# Patient Record
Sex: Female | Born: 1940
Health system: Southern US, Community
[De-identification: ages and names within clinical notes are randomized; demographics above are authoritative.]

## PROBLEM LIST (undated history)

## (undated) DIAGNOSIS — M8580 Other specified disorders of bone density and structure, unspecified site: Secondary | ICD-10-CM

## (undated) DIAGNOSIS — B029 Zoster without complications: Secondary | ICD-10-CM

## (undated) HISTORY — DX: Zoster without complications: B02.9

## (undated) HISTORY — PX: BREAST SURGERY: SHX581

## (undated) HISTORY — PX: APPENDECTOMY: SHX54

## (undated) HISTORY — DX: Other specified disorders of bone density and structure, unspecified site: M85.80

## (undated) HISTORY — PX: ABDOMINAL HYSTERECTOMY: SHX81

---

## 2008-03-05 ENCOUNTER — Ambulatory Visit: Payer: Self-pay | Admitting: Family Medicine

## 2008-03-05 DIAGNOSIS — M8580 Other specified disorders of bone density and structure, unspecified site: Secondary | ICD-10-CM

## 2008-03-05 DIAGNOSIS — C4490 Unspecified malignant neoplasm of skin, unspecified: Secondary | ICD-10-CM

## 2008-03-05 HISTORY — DX: Other specified disorders of bone density and structure, unspecified site: M85.80

## 2008-09-19 ENCOUNTER — Ambulatory Visit: Payer: Self-pay | Admitting: Family Medicine

## 2008-09-19 DIAGNOSIS — R51 Headache: Secondary | ICD-10-CM | POA: Insufficient documentation

## 2008-09-19 DIAGNOSIS — R519 Headache, unspecified: Secondary | ICD-10-CM | POA: Insufficient documentation

## 2008-09-19 DIAGNOSIS — H1851 Endothelial corneal dystrophy: Secondary | ICD-10-CM

## 2008-10-03 ENCOUNTER — Encounter: Payer: Self-pay | Admitting: Family Medicine

## 2008-10-06 LAB — CONVERTED CEMR LAB
ALT: 14 units/L (ref 0–35)
AST: 17 units/L (ref 0–37)
Albumin: 4.3 g/dL (ref 3.5–5.2)
Calcium: 9.8 mg/dL (ref 8.4–10.5)
Chloride: 105 meq/L (ref 96–112)
Eosinophils Absolute: 0.3 10*3/uL (ref 0.0–0.7)
Hemoglobin: 14.1 g/dL (ref 12.0–15.0)
Lymphs Abs: 2.2 10*3/uL (ref 0.7–4.0)
MCV: 89.3 fL (ref 78.0–100.0)
Monocytes Relative: 10 % (ref 3–12)
Neutro Abs: 3.2 10*3/uL (ref 1.7–7.7)
Neutrophils Relative %: 50 % (ref 43–77)
Potassium: 4.6 meq/L (ref 3.5–5.3)
RBC: 5.04 M/uL (ref 3.87–5.11)
Sed Rate: 13 mm/hr (ref 0–22)
TSH: 2.947 microintl units/mL (ref 0.350–4.500)
Vit D, 25-Hydroxy: 18 ng/mL — ABNORMAL LOW (ref 30–89)
WBC: 6.5 10*3/uL (ref 4.0–10.5)

## 2008-10-09 ENCOUNTER — Encounter: Payer: Self-pay | Admitting: Family Medicine

## 2008-10-10 ENCOUNTER — Telehealth: Payer: Self-pay | Admitting: Family Medicine

## 2009-05-05 ENCOUNTER — Telehealth: Payer: Self-pay | Admitting: Family Medicine

## 2009-05-06 ENCOUNTER — Ambulatory Visit: Payer: Self-pay | Admitting: Family Medicine

## 2009-05-08 ENCOUNTER — Telehealth (INDEPENDENT_AMBULATORY_CARE_PROVIDER_SITE_OTHER): Payer: Self-pay | Admitting: *Deleted

## 2009-05-12 ENCOUNTER — Encounter: Payer: Self-pay | Admitting: Family Medicine

## 2010-03-23 NOTE — Progress Notes (Signed)
Summary: Out of work note  Phone Note Call from Patient Call back at [employer] [phone]   Caller: Patient Call For: [name] MD Summary of Call: Pt calls needing out of work note for Tuesday 15 through the 18th. fax to [phone] Initial call taken by: Kathlene November,  May 08, 2009 10:44 AM  Follow-up for Phone Call        OK to give work notes.  Follow-up by: [name] MD,  May 08, 2009 11:57 AM

## 2010-03-23 NOTE — Letter (Signed)
Summary: Out of Work  [facility]  [address]   [place], [place] [address]   Phone: [phone]  Fax: [phone]    May 12, 2009   Employee:  GLENIS MUSOLF [facility]    To Whom It May Concern:   For Medical reasons, please excuse the above named employee from work for the following dates:  Start:   May 05, 2009  End:   May 08, 2009  If you need additional information, please feel free to contact our office.         Sincerely,    [name], MD

## 2010-03-23 NOTE — Assessment & Plan Note (Signed)
Summary: FLU   Vital Signs:  Patient profile:   70 year old female Height:      62 inches Weight:      139 pounds O2 Sat:      99 % on Room air Temp:     97.8 degrees F oral Pulse rate:   88 / minute BP sitting:   109 / 76  (left arm) Cuff size:   regular  Vitals Entered By: Kathlene November (May 06, 2009 10:51 AM)  O2 Flow:  Room air CC: Monday- started with sneezing by night time chills and sore throat, H/A, head congestion, nasal congestion, alot of cough- actually feels better today per pt   Primary Care Provider:  [name] MD  CC:  Monday- started with sneezing by night time chills and sore throat, H/A, head congestion, nasal congestion, and alot of cough- actually feels better today per pt.  History of Present Illness: Monday- started with sneezing by night time chills and sore throat, H/A, head congestion, nasal congestion, alot of cough- actually feels better today per pt.  Bodyaches.  Took Nyquail and Tylenol and a throat soothing tea. Throat feels a little better today. Nasal congestion is improved.  Fever to 100.7 last night. + sick contacts (daughter had the flu).  NEver had a flu shot.  No N/V/D.  Dec appetite. Has mostly been in bed.    Current Medications (verified): 1)  Calcium 600 Mg Tabs (Calcium) .... Take One Tablet By Mouth Daily  Allergies (verified): No Known Drug Allergies  Comments:  Nurse/Medical Assistant: The patient's medications and allergies were reviewed with the patient and were updated in the Medication and Allergy Lists. Kathlene November (May 06, 2009 10:52 AM)  Physical Exam  General:  Well-developed,well-nourished,in no acute distress; alert,appropriate and cooperative throughout examination Head:  Normocephalic and atraumatic without obvious abnormalities. No apparent alopecia or balding. Eyes:  No corneal or conjunctival inflammation noted. EOMI. Perrla. Ears:  External ear exam shows no significant lesions or deformities.   Otoscopic examination reveals clear canals, tympanic membranes are intact bilaterally without bulging, retraction, inflammation or discharge. Hearing is grossly normal bilaterally. Nose:  External nasal examination shows no deformity or inflammation. Nasal mucosa are pink and moist without lesions or exudates. Mouth:  Oral mucosa and oropharynx without lesions or exudates.  Teeth in good repair. Neck:  No deformities, masses, or tenderness noted. Lungs:  Normal respiratory effort, chest expands symmetrically. Lungs are clear to auscultation, no crackles or wheezes. Heart:  Normal rate and regular rhythm. S1 and S2 normal without gallop, murmur, click, rub or other extra sounds. Skin:  no rashes.   Cervical Nodes:  No lymphadenopathy noted Psych:  Cognition and judgment appear intact. Alert and cooperative with normal attention span and concentration. No apparent delusions, illusions, hallucinations   Impression & Recommendations:  Problem # 1:  INFLUENZA (ICD-487.8)  Rest, increase fluids, use Tylenol [phone] mg every 4-6 hours, and avoid contact with others. call office if no improvement in 5-7 days or if symptoms worsen.   Complete Medication List: 1)  Calcium 600 Mg Tabs (Calcium) .... Take one tablet by mouth daily

## 2010-03-23 NOTE — Progress Notes (Signed)
Summary: Sick  Phone Note Call from Patient Call back at Home Phone [phone]   Caller: Patient Call For: [name] MD Summary of Call: Pt calls with cough, chills, sore throat, H/A, fever- was with daughter last week and daughter called her and was dx with the flu- her symtoms starte last night- offered her an appointmnet this morning and she said she can't - she cant get OOB. Uses [facility] on [address]. Please advise Initial call taken by: Kathlene November,  May 05, 2009 8:28 AM  Follow-up for Phone Call        If feels can get out of bedt can come in tomorrow. If it is the flu then may be a canddiate for tamiflu if evaluated in the first 48 hours.  Follow-up by: [name] MD,  May 05, 2009 9:59 AM  Additional Follow-up for Phone Call Additional follow up Details #1::        Pt notified and sent to schedule an appointment Additional Follow-up by: Kathlene November,  May 05, 2009 10:03 AM

## 2010-07-08 ENCOUNTER — Encounter: Payer: Self-pay | Admitting: Family Medicine

## 2010-07-13 ENCOUNTER — Encounter: Payer: Self-pay | Admitting: Family Medicine

## 2010-09-14 ENCOUNTER — Ambulatory Visit (INDEPENDENT_AMBULATORY_CARE_PROVIDER_SITE_OTHER): Payer: Commercial Indemnity | Admitting: Family Medicine

## 2010-09-14 ENCOUNTER — Encounter: Payer: Self-pay | Admitting: Family Medicine

## 2010-09-14 DIAGNOSIS — Z23 Encounter for immunization: Secondary | ICD-10-CM

## 2010-09-14 DIAGNOSIS — Z0000 Encounter for general adult medical examination without abnormal findings: Secondary | ICD-10-CM

## 2010-09-14 DIAGNOSIS — Z1231 Encounter for screening mammogram for malignant neoplasm of breast: Secondary | ICD-10-CM

## 2010-09-14 DIAGNOSIS — Z2911 Encounter for prophylactic immunotherapy for respiratory syncytial virus (RSV): Secondary | ICD-10-CM

## 2010-09-14 LAB — LIPID PANEL
LDL Cholesterol: 66 mg/dL (ref 0–99)
Triglycerides: 104 mg/dL (ref <150)
VLDL: 21 mg/dL (ref 0–40)

## 2010-09-14 NOTE — Patient Instructions (Signed)
Keep up a regular exercise program and make sure you are eating a healthy diet Keep taking your  calcium supplement (500mg  twice a day). We will schedule your mammogram.

## 2010-09-14 NOTE — Progress Notes (Signed)
  Subjective:     Alexis Small is a 70 y.o. female and is here for a comprehensive physical exam. The patient reports no problems.Seh stopped eating all dairy adn all caffeine and feels much better. Her left side pain has resolved.  She does take a calcium supplement and gets some exercise.   History   Social History  . Marital Status: Widowed    Spouse Name: N/A    Number of Children: 3  . Years of Education: N/A   Occupational History  .  [employer]   Social History Main Topics  . Smoking status: Never Smoker   . Smokeless tobacco: Not on file  . Alcohol Use: No  . Drug Use: No  . 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   Other Topics Concern  . Not on file   Social History Narrative   Walking for exercise.  No daily caffeine.    Health Maintenance  Topic Date Due  . Tetanus/tdap  08/15/1959  . Zostavax  08/14/2000  . Pneumococcal Polysaccharide Vaccine Age 29 And Over  08/14/2005  . Influenza Vaccine  04/17/2011  . Colonoscopy  09/13/2020    The following portions of the patient's history were reviewed and updated as appropriate: allergies, current medications, past family history, past medical history, past social history and past surgical history.  Review of Systems A comprehensive review of systems was negative.   Objective:    BP 128/75  Pulse 73  Ht 5' 1.25" (1.556 m)  Wt 145 lb (65.772 kg)  BMI 27.17 kg/m2 General appearance: alert, cooperative and appears stated age Head: Normocephalic, without obvious abnormality, atraumatic Eyes: conjunctiva are clear, PEERLA, EOMi Ears: normal TM's and external ear canals both ears Nose: no discharge Throat: lips, mucosa, and tongue normal; teeth and gums normal Neck: no adenopathy, no carotid bruit, supple, symmetrical, trachea midline and thyroid not enlarged, symmetric, no tenderness/mass/nodules Back: symmetric, no curvature. ROM normal.  No CVA tenderness. Lungs: clear to auscultation bilaterally Breasts: normal appearance, no masses or tenderness Heart: regular rate and rhythm, S1, S2 normal, no murmur, click, rub or gallop Abdomen: soft, non-tender; bowel sounds normal; no masses,  no organomegaly Extremities: extremities normal, atraumatic, no cyanosis or edema Pulses: 2+ and symmetric Skin: Skin color, texture, turgor normal. No rashes or lesions Lymph nodes: Cervical, supraclavicular, and axillary nodes normal. Neurologic: Alert and oriented X 3, normal strength and tone. Normal symmetric reflexes. Normal coordination and gait    Assessment:    Healthy female exam.   Plan:     See After Visit Summary for Counseling Recommendations  1. Given tdap and shingles vaccines 2. Will look in old chart for PNA vaccines 3. She declines all future flu vaccinse 4. She declines bone density 5. Last mammo 4 years ago but she is agreeable to that.  Start a regular exercise program and make sure you are eating a healthy diet Try to eat 4 servings of dairy a day or take a calcium supplement (500mg  twice a day). Your vaccines are up to date.

## 2010-09-15 ENCOUNTER — Telehealth: Payer: Self-pay | Admitting: Family Medicine

## 2010-09-15 LAB — COMPLETE METABOLIC PANEL WITH GFR
ALT: <8 U/L (ref 0–35)
AST: 15 U/L (ref 0–37)
Albumin: 4.3 g/dL (ref 3.5–5.2)
Calcium: 9.3 mg/dL (ref 8.4–10.5)
Chloride: 106 mEq/L (ref 96–112)
Creat: 0.81 mg/dL (ref 0.50–1.10)
Potassium: 4.1 mEq/L (ref 3.5–5.3)

## 2010-09-15 NOTE — Telephone Encounter (Signed)
Call patient: Blood sugar still borderline. Vitamin D is definitely coming up. If still technically limited that I missed a normal range. Keep taking her vitamin D supplement. Cholesterol looks great

## 2010-09-15 NOTE — Telephone Encounter (Signed)
Left message on vm with results  

## 2010-11-09 ENCOUNTER — Ambulatory Visit
Admission: RE | Admit: 2010-11-09 | Discharge: 2010-11-09 | Disposition: A | Discharge status: Home / Self Care | Payer: Commercial Indemnity | Source: Ambulatory Visit | Attending: Family Medicine | Admitting: Family Medicine

## 2010-11-09 DIAGNOSIS — Z1231 Encounter for screening mammogram for malignant neoplasm of breast: Secondary | ICD-10-CM

## 2010-11-12 ENCOUNTER — Telehealth: Payer: Self-pay | Admitting: *Deleted

## 2010-11-12 NOTE — Telephone Encounter (Signed)
Message copied by Wyline Beady on Fri Nov 12, 2010  2:56 PM ------      Message from: [name] D      Created: Fri Nov 12, 2010 12:36 PM       Please call patient. Normal mammogram.  Repeat in 1 year.

## 2010-11-12 NOTE — Telephone Encounter (Signed)
Pt.notified

## 2010-11-23 ENCOUNTER — Ambulatory Visit (INDEPENDENT_AMBULATORY_CARE_PROVIDER_SITE_OTHER): Payer: Commercial Indemnity | Admitting: Family Medicine

## 2010-11-23 ENCOUNTER — Other Ambulatory Visit: Payer: Self-pay | Admitting: Family Medicine

## 2010-11-23 ENCOUNTER — Encounter: Payer: Self-pay | Admitting: Family Medicine

## 2010-11-23 VITALS — BP 134/84 | HR 70 | Temp 98.5°F | Wt 147.0 lb

## 2010-11-23 DIAGNOSIS — J029 Acute pharyngitis, unspecified: Secondary | ICD-10-CM

## 2010-11-23 NOTE — Progress Notes (Signed)
  Subjective:    Patient ID: Alexis Small, female    DOB: 18-Aug-1940, 70 y.o.   MRN: [id number]  HPI  ST on the Rt side for 4 days. Painful to swallow.  No fever.  No nasal congestion or drainage. No cough.  No sneezing or itching.  No ear pain or pressure today. Some era irritation on the RT on Sat. Has been taking nyquail nad salt water gargles. + sick contacts. Has been suing lozenges as well.   Review of Systems     Objective:   Physical Exam  Constitutional: She is oriented to person, place, and time. She appears well-developed and well-nourished.  HENT:  Head: Normocephalic and atraumatic.  Right Ear: External ear normal.  Left Ear: External ear normal.  Nose: Nose normal.  Mouth/Throat: Oropharynx is clear and moist.       TMs and canals are clear.   Eyes: Conjunctivae and EOM are normal. Pupils are equal, round, and reactive to light.  Neck: Neck supple. No thyromegaly present.       MIld tender bilat ant cerv LN.   Cardiovascular: Normal rate, regular rhythm and normal heart sounds.   Pulmonary/Chest: Effort normal and breath sounds normal. She has no wheezes.  Lymphadenopathy:    She has cervical adenopathy.  Neurological: She is alert and oriented to person, place, and time.  Skin: Skin is warm and dry.  Psychiatric: She has a normal mood and affect. Her behavior is normal.          Assessment & Plan:  Pharyngitis - Rapid is neg.  Will send a culture. Continue salt water gargles. Recommend zinc lozenges to shorten the duration of hte illness. Call if getting worse or just not getting better.

## 2010-11-23 NOTE — Patient Instructions (Signed)
Recommend zinc lozenges to shorten the duration of hte illness. Call if getting worse or just not getting better.

## 2010-11-24 ENCOUNTER — Telehealth: Payer: Self-pay | Admitting: Family Medicine

## 2010-11-24 NOTE — Telephone Encounter (Signed)
Pt seen yesterday and is complaining of ST, but since she called this am is starting to feel better.  Will call tomorrow if gets worse. Jarvis Newcomer, LPN [facility]

## 2010-11-28 LAB — CULTURE, GROUP A STREP: Organism ID, Bacteria: NORMAL

## 2010-11-29 ENCOUNTER — Telehealth: Payer: Self-pay | Admitting: *Deleted

## 2010-11-29 NOTE — Telephone Encounter (Signed)
Message copied by Lanae Crumbly on Mon Nov 29, 2010  9:35 AM ------      Message from: [name] D      Created: Mon Nov 29, 2010  7:46 AM       Strep throat culture is neg. Hopefully she is starting to feel better.

## 2010-11-29 NOTE — Telephone Encounter (Signed)
Pt notiifed of results. States is feeling much better. KJ LPN

## 2011-05-23 ENCOUNTER — Ambulatory Visit: Payer: Commercial Indemnity | Admitting: Physician Assistant

## 2011-07-20 ENCOUNTER — Encounter: Payer: Self-pay | Admitting: Physician Assistant

## 2011-07-20 ENCOUNTER — Ambulatory Visit (INDEPENDENT_AMBULATORY_CARE_PROVIDER_SITE_OTHER): Payer: Medicare Other | Admitting: Physician Assistant

## 2011-07-20 VITALS — BP 142/72 | HR 67 | Ht 61.25 in | Wt 149.0 lb

## 2011-07-20 DIAGNOSIS — M25552 Pain in left hip: Secondary | ICD-10-CM

## 2011-07-20 DIAGNOSIS — M545 Low back pain: Secondary | ICD-10-CM

## 2011-07-20 DIAGNOSIS — M25559 Pain in unspecified hip: Secondary | ICD-10-CM

## 2011-07-20 MED ORDER — CYCLOBENZAPRINE HCL 5 MG PO TABS: 5.0000 mg | ORAL_TABLET | Freq: Every evening | ORAL | Status: DC | PRN

## 2011-07-20 MED ORDER — TRAMADOL HCL 50 MG PO TABS: 50.0000 mg | ORAL_TABLET | Freq: Three times a day (TID) | ORAL | Status: AC | PRN

## 2011-07-20 NOTE — Patient Instructions (Addendum)
Continue taking Motrin up to 800mg  three times a day. Use flexeril at night. Tramadol for pain every 8hrs or as needed. Apply ice on affected areas.  Low Back Strain with Rehab A strain is an injury in which a tendon or muscle is torn. The muscles and tendons of the lower back are vulnerable to strains. However, these muscles and tendons are very strong and require a great force to be injured. Strains are classified into three categories. Grade 1 strains cause pain, but the tendon is not lengthened. Grade 2 strains include a lengthened ligament, due to the ligament being stretched or partially ruptured. With grade 2 strains there is still function, although the function may be decreased. Grade 3 strains involve a complete tear of the tendon or muscle, and function is usually impaired. SYMPTOMS   Pain in the lower back.   Pain that affects one side more than the other.   Pain that gets worse with movement and may be felt in the hip, buttocks, or back of the thigh.   Muscle spasms of the muscles in the back.   Swelling along the muscles of the back.   Loss of strength of the back muscles.   Crackling sound (crepitation) when the muscles are touched.  CAUSES  Lower back strains occur when a force is placed on the muscles or tendons that is greater than they can handle. Common causes of injury include:  Prolonged overuse of the muscle-tendon units in the lower back, usually from incorrect posture.   A single violent injury or force applied to the back.  RISK INCREASES WITH:  Sports that involve twisting forces on the spine or a lot of bending at the waist (football, rugby, weightlifting, bowling, golf, tennis, speed skating, racquetball, swimming, running, gymnastics, diving).   Poor strength and flexibility.   Failure to warm up properly before activity.   Family history of lower back pain or disk disorders.   Previous back injury or surgery (especially fusion).   Poor posture with  lifting, especially heavy objects.   Prolonged sitting, especially with poor posture.  PREVENTION   Learn and use proper posture when sitting or lifting (maintain proper posture when sitting, lift using the knees and legs, not at the waist).   Warm up and stretch properly before activity.   Allow for adequate recovery between workouts.   Maintain physical fitness:   Strength, flexibility, and endurance.   Cardiovascular fitness.  PROGNOSIS  If treated properly, lower back strains usually heal within 6 weeks. RELATED COMPLICATIONS   Recurring symptoms, resulting in a chronic problem.   Chronic inflammation, scarring, and partial muscle-tendon tear.   Delayed healing or resolution of symptoms.   Prolonged disability.  TREATMENT  Treatment first involves the use of ice and medicine, to reduce pain and inflammation. The use of strengthening and stretching exercises may help reduce pain with activity. These exercises may be performed at home or with a therapist. Severe injuries may require referral to a therapist for further evaluation and treatment, such as ultrasound. Your caregiver may advise that you wear a back brace or corset, to help reduce pain and discomfort. Often, prolonged bed rest results in greater harm then benefit. Corticosteroid injections may be recommended. However, these should be reserved for the most serious cases. It is important to avoid using your back when lifting objects. At night, sleep on your back on a firm mattress with a pillow placed under your knees. If non-surgical treatment is unsuccessful, surgery may  be needed.  MEDICATION   If pain medicine is needed, nonsteroidal anti-inflammatory medicines (aspirin and ibuprofen), or other minor pain relievers (acetaminophen), are often advised.   Do not take pain medicine for 7 days before surgery.   Prescription pain relievers may be given, if your caregiver thinks they are needed. Use only as directed and only  as much as you need.   Ointments applied to the skin may be helpful.   Corticosteroid injections may be given by your caregiver. These injections should be reserved for the most serious cases, because they may only be given a certain number of times.  HEAT AND COLD  Cold treatment (icing) should be applied for 10 to 15 minutes every 2 to 3 hours for inflammation and pain, and immediately after activity that aggravates your symptoms. Use ice packs or an ice massage.   Heat treatment may be used before performing stretching and strengthening activities prescribed by your caregiver, physical therapist, or athletic trainer. Use a heat pack or a warm water soak.  SEEK MEDICAL CARE IF:   Symptoms get worse or do not improve in 2 to 4 weeks, despite treatment.   You develop numbness, weakness, or loss of bowel or bladder function.   New, unexplained symptoms develop. (Drugs used in treatment may produce side effects.)  EXERCISES  RANGE OF MOTION (ROM) AND STRETCHING EXERCISES - Low Back Strain Most people with lower back pain will find that their symptoms get worse with excessive bending forward (flexion) or arching at the lower back (extension). The exercises which will help resolve your symptoms will focus on the opposite motion.  Your physician, physical therapist or athletic trainer will help you determine which exercises will be most helpful to resolve your lower back pain. Do not complete any exercises without first consulting with your caregiver. Discontinue any exercises which make your symptoms worse until you speak to your caregiver.  If you have pain, numbness or tingling which travels down into your buttocks, leg or foot, the goal of the therapy is for these symptoms to move closer to your back and eventually resolve. Sometimes, these leg symptoms will get better, but your lower back pain may worsen. This is typically an indication of progress in your rehabilitation. Be very alert to any  changes in your symptoms and the activities in which you participated in the 24 hours prior to the change. Sharing this information with your caregiver will allow him/her to most efficiently treat your condition.  These exercises may help you when beginning to rehabilitate your injury. Your symptoms may resolve with or without further involvement from your physician, physical therapist or athletic trainer. While completing these exercises, remember:   Restoring tissue flexibility helps normal motion to return to the joints. This allows healthier, less painful movement and activity.   An effective stretch should be held for at least 30 seconds.   A stretch should never be painful. You should only feel a gentle lengthening or release in the stretched tissue.  FLEXION RANGE OF MOTION AND STRETCHING EXERCISES: STRETCH - Flexion, Single Knee to Chest   Lie on a firm bed or floor with both legs extended in front of you.   Keeping one leg in contact with the floor, bring your opposite knee to your chest. Hold your leg in place by either grabbing behind your thigh or at your knee.   Pull until you feel a gentle stretch in your lower back. Hold __________ seconds.   Slowly release your  grasp and repeat the exercise with the opposite side.  Repeat __________ times. Complete this exercise __________ times per day.  STRETCH - Flexion, Double Knee to Chest   Lie on a firm bed or floor with both legs extended in front of you.   Keeping one leg in contact with the floor, bring your opposite knee to your chest.   Tense your stomach muscles to support your back and then lift your other knee to your chest. Hold your legs in place by either grabbing behind your thighs or at your knees.   Pull both knees toward your chest until you feel a gentle stretch in your lower back. Hold __________ seconds.   Tense your stomach muscles and slowly return one leg at a time to the floor.  Repeat __________ times.  Complete this exercise __________ times per day.  STRETCH - Low Trunk Rotation  Lie on a firm bed or floor. Keeping your legs in front of you, bend your knees so they are both pointed toward the ceiling and your feet are flat on the floor.   Extend your arms out to the side. This will stabilize your upper body by keeping your shoulders in contact with the floor.   Gently and slowly drop both knees together to one side until you feel a gentle stretch in your lower back. Hold for __________ seconds.   Tense your stomach muscles to support your lower back as you bring your knees back to the starting position. Repeat the exercise to the other side.  Repeat __________ times. Complete this exercise __________ times per day  EXTENSION RANGE OF MOTION AND FLEXIBILITY EXERCISES: STRETCH - Extension, Prone on Elbows   Lie on your stomach on the floor, a bed will be too soft. Place your palms about shoulder width apart and at the height of your head.   Place your elbows under your shoulders. If this is too painful, stack pillows under your chest.   Allow your body to relax so that your hips drop lower and make contact more completely with the floor.   Hold this position for __________ seconds.   Slowly return to lying flat on the floor.  Repeat __________ times. Complete this exercise __________ times per day.  RANGE OF MOTION - Extension, Prone Press Ups  Lie on your stomach on the floor, a bed will be too soft. Place your palms about shoulder width apart and at the height of your head.   Keeping your back as relaxed as possible, slowly straighten your elbows while keeping your hips on the floor. You may adjust the placement of your hands to maximize your comfort. As you gain motion, your hands will come more underneath your shoulders.   Hold this position __________ seconds.   Slowly return to lying flat on the floor.  Repeat __________ times. Complete this exercise __________ times per day.    RANGE OF MOTION- Quadruped, Neutral Spine   Assume a hands and knees position on a firm surface. Keep your hands under your shoulders and your knees under your hips. You may place padding under your knees for comfort.   Drop your head and point your tail bone toward the ground below you. This will round out your lower back like an angry cat. Hold this position for __________ seconds.   Slowly lift your head and release your tail bone so that your back sags into a large arch, like an old horse.   Hold this position for __________  seconds.   Repeat this until you feel limber in your lower back.   Now, find your "sweet spot." This will be the most comfortable position somewhere between the two previous positions. This is your neutral spine. Once you have found this position, tense your stomach muscles to support your lower back.   Hold this position for __________ seconds.  Repeat __________ times. Complete this exercise __________ times per day.  STRENGTHENING EXERCISES - Low Back Strain These exercises may help you when beginning to rehabilitate your injury. These exercises should be done near your "sweet spot." This is the neutral, low-back arch, somewhere between fully rounded and fully arched, that is your least painful position. When performed in this safe range of motion, these exercises can be used for people who have either a flexion or extension based injury. These exercises may resolve your symptoms with or without further involvement from your physician, physical therapist or athletic trainer. While completing these exercises, remember:   Muscles can gain both the endurance and the strength needed for everyday activities through controlled exercises.   Complete these exercises as instructed by your physician, physical therapist or athletic trainer. Increase the resistance and repetitions only as guided.   You may experience muscle soreness or fatigue, but the pain or discomfort you  are trying to eliminate should never worsen during these exercises. If this pain does worsen, stop and make certain you are following the directions exactly. If the pain is still present after adjustments, discontinue the exercise until you can discuss the trouble with your caregiver.  STRENGTHENING - Deep Abdominals, Pelvic Tilt  Lie on a firm bed or floor. Keeping your legs in front of you, bend your knees so they are both pointed toward the ceiling and your feet are flat on the floor.   Tense your lower abdominal muscles to press your lower back into the floor. This motion will rotate your pelvis so that your tail bone is scooping upwards rather than pointing at your feet or into the floor.   With a gentle tension and even breathing, hold this position for __________ seconds.  Repeat __________ times. Complete this exercise __________ times per day.  STRENGTHENING - Abdominals, Crunches   Lie on a firm bed or floor. Keeping your legs in front of you, bend your knees so they are both pointed toward the ceiling and your feet are flat on the floor. Cross your arms over your chest.   Slightly tip your chin down without bending your neck.   Tense your abdominals and slowly lift your trunk high enough to just clear your shoulder blades. Lifting higher can put excessive stress on the lower back and does not further strengthen your abdominal muscles.   Control your return to the starting position.  Repeat __________ times. Complete this exercise __________ times per day.  STRENGTHENING - Quadruped, Opposite UE/LE Lift   Assume a hands and knees position on a firm surface. Keep your hands under your shoulders and your knees under your hips. You may place padding under your knees for comfort.   Find your neutral spine and gently tense your abdominal muscles so that you can maintain this position. Your shoulders and hips should form a rectangle that is parallel with the floor and is not twisted.    Keeping your trunk steady, lift your right hand no higher than your shoulder and then your left leg no higher than your hip. Make sure you are not holding your breath. Hold this  position __________ seconds.   Continuing to keep your abdominal muscles tense and your back steady, slowly return to your starting position. Repeat with the opposite arm and leg.  Repeat __________ times. Complete this exercise __________ times per day.  STRENGTHENING - Lower Abdominals, Double Knee Lift  Lie on a firm bed or floor. Keeping your legs in front of you, bend your knees so they are both pointed toward the ceiling and your feet are flat on the floor.   Tense your abdominal muscles to brace your lower back and slowly lift both of your knees until they come over your hips. Be certain not to hold your breath.   Hold __________ seconds. Using your abdominal muscles, return to the starting position in a slow and controlled manner.  Repeat __________ times. Complete this exercise __________ times per day.  POSTURE AND BODY MECHANICS CONSIDERATIONS - Low Back Strain Keeping correct posture when sitting, standing or completing your activities will reduce the stress put on different body tissues, allowing injured tissues a chance to heal and limiting painful experiences. The following are general guidelines for improved posture. Your physician or physical therapist will provide you with any instructions specific to your needs. While reading these guidelines, remember:  The exercises prescribed by your provider will help you have the flexibility and strength to maintain correct postures.   The correct posture provides the best environment for your joints to work. All of your joints have less wear and tear when properly supported by a spine with good posture. This means you will experience a healthier, less painful body.   Correct posture must be practiced with all of your activities, especially prolonged sitting and  standing. Correct posture is as important when doing repetitive low-stress activities (typing) as it is when doing a single heavy-load activity (lifting).  RESTING POSITIONS Consider which positions are most painful for you when choosing a resting position. If you have pain with flexion-based activities (sitting, bending, stooping, squatting), choose a position that allows you to rest in a less flexed posture. You would want to avoid curling into a fetal position on your side. If your pain worsens with extension-based activities (prolonged standing, working overhead), avoid resting in an extended position such as sleeping on your stomach. Most people will find more comfort when they rest with their spine in a more neutral position, neither too rounded nor too arched. Lying on a non-sagging bed on your side with a pillow between your knees, or on your back with a pillow under your knees will often provide some relief. Keep in mind, being in any one position for a prolonged period of time, no matter how correct your posture, can still lead to stiffness. PROPER SITTING POSTURE In order to minimize stress and discomfort on your spine, you must sit with correct posture. Sitting with good posture should be effortless for a healthy body. Returning to good posture is a gradual process. Many people can work toward this most comfortably by using various supports until they have the flexibility and strength to maintain this posture on their own. When sitting with proper posture, your ears will fall over your shoulders and your shoulders will fall over your hips. You should use the back of the chair to support your upper back. Your lower back will be in a neutral position, just slightly arched. You may place a small pillow or folded towel at the base of your lower back for support.  When working at a desk, create an  environment that supports good, upright posture. Without extra support, muscles tire, which leads to  excessive strain on joints and other tissues. Keep these recommendations in mind: CHAIR:  A chair should be able to slide under your desk when your back makes contact with the back of the chair. This allows you to work closely.   The chair's height should allow your eyes to be level with the upper part of your monitor and your hands to be slightly lower than your elbows.  BODY POSITION  Your feet should make contact with the floor. If this is not possible, use a foot rest.   Keep your ears over your shoulders. This will reduce stress on your neck and lower back.  INCORRECT SITTING POSTURES  If you are feeling tired and unable to assume a healthy sitting posture, do not slouch or slump. This puts excessive strain on your back tissues, causing more damage and pain. Healthier options include:  Using more support, like a lumbar pillow.   Switching tasks to something that requires you to be upright or walking.   Talking a brief walk.   Lying down to rest in a neutral-spine position.  PROLONGED STANDING WHILE SLIGHTLY LEANING FORWARD  When completing a task that requires you to lean forward while standing in one place for a long time, place either foot up on a stationary 2-4 inch high object to help maintain the best posture. When both feet are on the ground, the lower back tends to lose its slight inward curve. If this curve flattens (or becomes too large), then the back and your other joints will experience too much stress, tire more quickly, and can cause pain. CORRECT STANDING POSTURES Proper standing posture should be assumed with all daily activities, even if they only take a few moments, like when brushing your teeth. As in sitting, your ears should fall over your shoulders and your shoulders should fall over your hips. You should keep a slight tension in your abdominal muscles to brace your spine. Your tailbone should point down to the ground, not behind your body, resulting in an  over-extended swayback posture.  INCORRECT STANDING POSTURES  Common incorrect standing postures include a forward head, locked knees and/or an excessive swayback. WALKING Walk with an upright posture. Your ears, shoulders and hips should all line-up. PROLONGED ACTIVITY IN A FLEXED POSITION When completing a task that requires you to bend forward at your waist or lean over a low surface, try to find a way to stabilize 3 out of 4 of your limbs. You can place a hand or elbow on your thigh or rest a knee on the surface you are reaching across. This will provide you more stability so that your muscles do not fatigue as quickly. By keeping your knees relaxed, or slightly bent, you will also reduce stress across your lower back. CORRECT LIFTING TECHNIQUES DO :   Assume a wide stance. This will provide you more stability and the opportunity to get as close as possible to the object which you are lifting.   Tense your abdominals to brace your spine. Bend at the knees and hips. Keeping your back locked in a neutral-spine position, lift using your leg muscles. Lift with your legs, keeping your back straight.   Test the weight of unknown objects before attempting to lift them.   Try to keep your elbows locked down at your sides in order get the best strength from your shoulders when carrying an object.  Always ask for help when lifting heavy or awkward objects.  INCORRECT LIFTING TECHNIQUES DO NOT:   Lock your knees when lifting, even if it is a small object.   Bend and twist. Pivot at your feet or move your feet when needing to change directions.   Assume that you can safely pick up even a paper clip without proper posture.  Document Released: 02/07/2005 Document Revised: 01/27/2011 Document Reviewed: 05/22/2008 [facility] Patient Information 2012 [place], [place].

## 2011-07-20 NOTE — Progress Notes (Signed)
  Subjective:    Patient ID: Alexis Small, female    DOB: 31-Mar-1940, 71 y.o.   MRN: [id number]  HPI Patient has a history of back problems and has had an exacerbation that started 3 days ago. It started from when she spent all day Sunday pruning bushes. She actually got stuck in garden and daughter had to get her out. Since her back has been hurting a lot and she has felt like she can't move. The pain in mostly in lower back and left hip.2 days ago it hurt ot even lay down. Now the pain is better when she sits or lays down. Standing and walking create a lot of pain. She has been taking a lot of ibuprofen daily and it does help. She tried some OTC patches for paian and they don't help either. She denies any numbness or tingling running down leg. She did try taking a hot bath and it helped some. Rates pain      Review of Systems     Objective:   Physical Exam  Constitutional: She is oriented to person, place, and time. She appears well-developed and well-nourished.  HENT:  Head: Normocephalic and atraumatic.  Right Ear: External ear normal.  Left Ear: External ear normal.  Nose: Nose normal.  Mouth/Throat: Oropharynx is clear and moist.  Cardiovascular: Normal rate, regular rhythm and normal heart sounds.   Pulmonary/Chest: Effort normal and breath sounds normal.  Musculoskeletal:       Limited ROM at waist not able to forward flex or extend. Pt had trouble getting out of chair. No bony tenderness over spine. Paraspinus tenderness in lumbar region. Negative bilateral straight leg test. Left hip pain over bursa. Strength 5/5 of bilateral legs.DtRs 2+ bilaterally.   Neurological: She is alert and oriented to person, place, and time.  Skin: Skin is warm and dry.  Psychiatric: She has a normal mood and affect. Her behavior is normal.          Assessment & Plan:  Low back pain/left hip pain- Continue taking Motrin up to 800mg  three times a day. Use flexeril at night. Tramadol for  pain every 8hrs or as needed. Apply ice on affected areas. Will not get x-rays today but if pain persists will get imaging and consider physical therapy. Will give stretches to do daily for pain.

## 2011-12-15 ENCOUNTER — Ambulatory Visit (INDEPENDENT_AMBULATORY_CARE_PROVIDER_SITE_OTHER): Payer: Medicare Other | Admitting: Family Medicine

## 2011-12-15 ENCOUNTER — Encounter: Payer: Self-pay | Admitting: Family Medicine

## 2011-12-15 VITALS — BP 132/77 | HR 64 | Ht 61.0 in | Wt 147.0 lb

## 2011-12-15 DIAGNOSIS — M810 Age-related osteoporosis without current pathological fracture: Secondary | ICD-10-CM

## 2011-12-15 DIAGNOSIS — Z0000 Encounter for general adult medical examination without abnormal findings: Secondary | ICD-10-CM

## 2011-12-15 DIAGNOSIS — N951 Menopausal and female climacteric states: Secondary | ICD-10-CM

## 2011-12-15 DIAGNOSIS — Z1231 Encounter for screening mammogram for malignant neoplasm of breast: Secondary | ICD-10-CM

## 2011-12-15 DIAGNOSIS — Z23 Encounter for immunization: Secondary | ICD-10-CM

## 2011-12-15 DIAGNOSIS — Z13220 Encounter for screening for lipoid disorders: Secondary | ICD-10-CM

## 2011-12-15 LAB — COMPLETE METABOLIC PANEL WITH GFR
ALT: 16 U/L (ref 0–35)
AST: 20 U/L (ref 0–37)
Albumin: 4.5 g/dL (ref 3.5–5.2)
Alkaline Phosphatase: 75 U/L (ref 39–117)
BUN: 15 mg/dL (ref 6–23)
Calcium: 9.8 mg/dL (ref 8.4–10.5)
Chloride: 104 mEq/L (ref 96–112)
Potassium: 4.7 mEq/L (ref 3.5–5.3)
Sodium: 141 mEq/L (ref 135–145)
Total Protein: 7.2 g/dL (ref 6.0–8.3)

## 2011-12-15 LAB — LIPID PANEL
Cholesterol: 198 mg/dL (ref 0–200)
LDL Cholesterol: 103 mg/dL — ABNORMAL HIGH (ref 0–99)
Total CHOL/HDL Ratio: 2.5 Ratio
VLDL: 16 mg/dL (ref 0–40)

## 2011-12-15 NOTE — Progress Notes (Signed)
Subjective:    Alexis Small is a 71 y.o. female who presents for Medicare Annual/Subsequent preventive examination.  Preventive Screening-Counseling & Management  Tobacco History  Smoking status  . Never Smoker   Smokeless tobacco  . Not on file     Problems Prior to Visit 1.   Current Problems (verified) Patient Active Problem List  Diagnosis  . CARCINOMA, SKIN, SQUAMOUS CELL  . FUCH'S DYSTROPHY  . OSTEOPOROSIS  . HEADACHE, CHRONIC    Medications Prior to Visit Current Outpatient Prescriptions on File Prior to Visit  Medication Sig Dispense Refill  . calcium carbonate (OS-CAL) 600 MG TABS Take 600 mg by mouth daily.        . cyclobenzaprine (FLEXERIL) 5 MG tablet Take 1 tablet (5 mg total) by mouth at bedtime as needed for muscle spasms.  30 tablet  0    Current Medications (verified) Current Outpatient Prescriptions  Medication Sig Dispense Refill  . calcium carbonate (OS-CAL) 600 MG TABS Take 600 mg by mouth daily.        . cyclobenzaprine (FLEXERIL) 5 MG tablet Take 1 tablet (5 mg total) by mouth at bedtime as needed for muscle spasms.  30 tablet  0     Allergies (verified) Review of patient's allergies indicates no known allergies.   PAST HISTORY  Family History Family History  Problem Relation Age of Onset  . Aneurysm Mother 10    cerebral  . Birth defects Neg Hx   . Coronary artery disease Sister 62    deceased age 12  . COPD Sister     smoker  . Cancer Sister     not sure what type.     Social History History  Substance Use Topics  . Smoking status: Never Smoker   . Smokeless tobacco: Not on file  . Alcohol Use: No     Are there smokers in your home (other than you)? No  Risk Factors Current exercise habits: walks some  Dietary issues discussed: none   Cardiac risk factors: advanced age (older than 6 for men, 63 for women) and sedentary lifestyle.  Depression Screen (Note: if answer to either of the following is "Yes", a  more complete depression screening is indicated)   Over the past two weeks, have you felt down, depressed or hopeless? No  Over the past two weeks, have you felt little interest or pleasure in doing things? No  Have you lost interest or pleasure in daily life? No  Do you often feel hopeless? No  Do you cry easily over simple problems? No  Activities of Daily Living In your present state of health, do you have any difficulty performing the following activities?:  Driving? No Managing money?  No Feeding yourself? No Getting from bed to chair? No Climbing a flight of stairs? No Preparing food and eating?: No Bathing or showering? No Getting dressed: No Getting to the toilet? No Using the toilet:No Moving around from place to place: No In the past year have you fallen or had a near fall?:No   Are you sexually active?  No  Do you have more than one partner?  No  Hearing Difficulties: Yes Do you often ask people to speak up or repeat themselves? Yes Do you experience ringing or noises in your ears? No Do you have difficulty understanding soft or whispered voices? Yes   Do you feel that you have a problem with memory? No  Do you often misplace items? No  Do you  feel safe at home?  Yes  Cognitive Testing  Alert? Yes  Normal Appearance?Yes  Oriented to person? Yes  Place? Yes   Time? Yes  Recall of three objects?  Yes  Can perform simple calculations? Yes  Displays appropriate judgment?Yes  Can read the correct time from a watch face?Yes   Advanced Directives have been discussed with the patient? Yes  List the Names of Other Physician/Practitioners you currently use: 1.    Indicate any recent Medical Services you may have received from other than [facility] providers in the past year (date may be approximate).  Immunization History  Administered Date(s) Administered  . Tdap 09/14/2010  . Zoster 09/14/2010    Screening Tests Health Maintenance  Topic Date Due  .  Pneumococcal Polysaccharide Vaccine Age 43 And Over  08/14/2005  . Influenza Vaccine  10/23/2011  . Colonoscopy  09/13/2020  . Tetanus/tdap  09/13/2020  . Zostavax  Completed    All answers were reviewed with the patient and necessary referrals were made:  METHENEY,CATHERINE, MD   12/15/2011   History reviewed: allergies, current medications, past family history, past medical history, past social history, past surgical history and problem list  Review of Systems A comprehensive review of systems was negative.    Objective:     Vision by Snellen chart: right eye:20/200, left eye:20/200  Body mass index is 27.78 kg/(m^2). BP 132/77  Pulse 64  Ht 5\' 1"  (1.549 m)  Wt 147 lb (66.679 kg)  BMI 27.78 kg/m2  BP 132/77  Pulse 64  Ht 5\' 1"  (1.549 m)  Wt 147 lb (66.679 kg)  BMI 27.78 kg/m2  General Appearance:    Alert, cooperative, no distress, appears stated age  Head:    Normocephalic, without obvious abnormality, atraumatic  Eyes:    PERRL, conjunctiva/corneas clear, EOM's intact, both eyes  Ears:    Normal TM's and external ear canals, both ears  Nose:   Nares normal, septum midline, mucosa normal, no drainage    or sinus tenderness  Throat:   Lips, mucosa, and tongue normal; teeth and gums normal  Neck:   Supple, symmetrical, trachea midline, no adenopathy;    thyroid:  no enlargement/tenderness/nodules; no carotid   bruit or JVD  Back:     Symmetric, no curvature, ROM normal, no CVA tenderness  Lungs:     Clear to auscultation bilaterally, respirations unlabored  Chest Wall:    No tenderness or deformity   Heart:    Regular rate and rhythm, S1 and S2 normal, no murmur, rub   or gallop  Breast Exam:    No tenderness, masses, or nipple abnormality, scars from breast reduction surgery well-healed.   Abdomen:     Soft, non-tender, bowel sounds active all four quadrants,    no masses, no organomegaly  Genitalia:    Not performed.   Rectal:    Nrt performed.   Extremities:    Extremities normal, atraumatic, no cyanosis or edema  Pulses:   2+ and symmetric all extremities  Skin:   Skin color, texture, turgor normal, no rashes or lesions  Lymph nodes:   Cervical, supraclavicular, and axillary nodes normal  Neurologic:   CNII-XII intact  and reflexes normal throughout       Assessment:     Medicare Annual Wellness Exam     Plan:     During the course of the visit the patient was educated and counseled about appropriate screening and preventive services including:    Pneumococcal vaccine  Influenza vaccine  Screening mammography  Bone densitometry screening  Declined flu vaccine   Diet review for nutrition referral? Yes ____  Not Indicated __x_   Patient Instructions (the written plan) was given to the patient.  Medicare Attestation I have personally reviewed: The patient's medical and social history Their use of alcohol, tobacco or illicit drugs Their current medications and supplements The patient's functional ability including ADLs,fall risks, home safety risks, cognitive, and hearing and visual impairment Diet and physical activities Evidence for depression or mood disorders  The patient's weight, height, BMI, and visual acuity have been recorded in the chart.  I have made referrals, counseling, and provided education to the patient based on review of the above and I have provided the patient with a written personalized care plan for preventive services.     METHENEY,CATHERINE, MD   12/15/2011    She prefers her bone density and mammogram be scheduled at [facility] in [facility].

## 2011-12-16 LAB — VITAMIN D 25 HYDROXY (VIT D DEFICIENCY, FRACTURES): Vit D, 25-Hydroxy: 50 ng/mL (ref 30–89)

## 2011-12-29 ENCOUNTER — Encounter: Payer: Medicare Other | Admitting: Family Medicine

## 2012-01-07 ENCOUNTER — Telehealth: Payer: Self-pay | Admitting: Family Medicine

## 2012-01-07 NOTE — Telephone Encounter (Signed)
Call pt: bone density shows risk fracture is 20.4% for 10 years which means she should be on a bisphosphonate like boniva or fosamax. If okw ith med let me know.  Repeat study in 2 years.

## 2012-01-09 ENCOUNTER — Telehealth: Payer: Self-pay | Admitting: *Deleted

## 2012-01-09 ENCOUNTER — Encounter: Payer: Self-pay | Admitting: *Deleted

## 2012-01-09 NOTE — Telephone Encounter (Signed)
The bone density of her hips did decrease.

## 2012-01-09 NOTE — Telephone Encounter (Signed)
Pt notified of her results. Pt wanted to know if she was in the osteopenia range. Advised pt that her risk fx is concerning to the point that she wants to rx this medication. Pt again wanted to know her range. I didn't see this on the report.please advise and call patient

## 2012-01-09 NOTE — Telephone Encounter (Signed)
Pt calls and wants to know results of bone density. States if test didn't change from 3 years ago she would like to just continue calcium. Says osteopenia but she says she has been that. Says if not worse she doesn't want to have to take a rx for it

## 2012-01-09 NOTE — Telephone Encounter (Signed)
Left message on vm and asked for pt to call me back

## 2012-01-10 ENCOUNTER — Telehealth: Payer: Self-pay | Admitting: *Deleted

## 2012-01-10 NOTE — Telephone Encounter (Signed)
She is in the osteopenia range. It but when we look at her risk we will get to things. We look at the actual range of osteopenia versus osteoporosis, and also look at the fracture risk. Whichever is the highest, is what we base treatment on. Thus because her risk of fracture is technically over 20% then it is recommended that she take a bisphosphonate. That is purely her decision, but it is my job to let her know what the guidelines recommend based on the results of her tests.

## 2012-01-11 ENCOUNTER — Telehealth: Payer: Self-pay | Admitting: *Deleted

## 2012-01-11 NOTE — Telephone Encounter (Signed)
Pt calls back and LM on VM. Returned pt call and had to leave message again to return our call. KG LPN

## 2012-01-11 NOTE — Telephone Encounter (Signed)
See other message

## 2012-01-12 NOTE — Telephone Encounter (Signed)
Awaiting patient call back

## 2012-01-13 ENCOUNTER — Telehealth: Payer: Self-pay | Admitting: *Deleted

## 2012-01-13 NOTE — Telephone Encounter (Signed)
See other messages

## 2012-01-13 NOTE — Telephone Encounter (Signed)
See other message

## 2012-02-07 ENCOUNTER — Encounter: Payer: Self-pay | Admitting: Family Medicine

## 2012-04-02 ENCOUNTER — Ambulatory Visit (INDEPENDENT_AMBULATORY_CARE_PROVIDER_SITE_OTHER): Payer: Medicare Other | Admitting: Physician Assistant

## 2012-04-02 ENCOUNTER — Encounter: Payer: Self-pay | Admitting: Physician Assistant

## 2012-04-02 ENCOUNTER — Telehealth: Payer: Self-pay | Admitting: Physician Assistant

## 2012-04-02 VITALS — BP 121/76 | HR 62 | Wt 150.0 lb

## 2012-04-02 DIAGNOSIS — J069 Acute upper respiratory infection, unspecified: Secondary | ICD-10-CM

## 2012-04-02 DIAGNOSIS — L219 Seborrheic dermatitis, unspecified: Secondary | ICD-10-CM

## 2012-04-02 DIAGNOSIS — R05 Cough: Secondary | ICD-10-CM

## 2012-04-02 MED ORDER — KETOCONAZOLE 2 % EX CREA: TOPICAL_CREAM | Freq: Every day | CUTANEOUS | Status: DC

## 2012-04-02 MED ORDER — SELENIUM SULFIDE 2.5 % EX LOTN: TOPICAL_LOTION | Freq: Every day | CUTANEOUS | Status: DC | PRN

## 2012-04-02 MED ORDER — AZITHROMYCIN 250 MG PO TABS: ORAL_TABLET | ORAL | Status: DC

## 2012-04-02 MED ORDER — HYDROCODONE-HOMATROPINE 5-1.5 MG/5ML PO SYRP: 5.0000 mL | ORAL_SOLUTION | Freq: Every evening | ORAL | Status: DC | PRN

## 2012-04-02 NOTE — Telephone Encounter (Signed)
Pt.notified

## 2012-04-02 NOTE — Telephone Encounter (Signed)
Call patient and let her know I sent something for the scaliness in her ear and if she is having same scaliness on her scalp.

## 2012-04-02 NOTE — Patient Instructions (Addendum)
Mucinex-D twice a day.  Umcka cold care- 3 drops on tongue three times a day.  Honey for cough.   Upper Respiratory Infection, Adult An upper respiratory infection (URI) is also known as the common cold. It is often caused by a type of germ (virus). Colds are easily spread (contagious). You can pass it to others by kissing, coughing, sneezing, or drinking out of the same glass. Usually, you get better in 1 or 2 weeks.  HOME CARE   Only take medicine as told by your doctor.  Use a warm mist humidifier or breathe in steam from a hot shower.  Drink enough water and fluids to keep your pee (urine) clear or pale yellow.  Get plenty of rest.  Return to work when your temperature is back to normal or as told by your doctor. You may use a face mask and wash your hands to stop your cold from spreading. GET HELP RIGHT AWAY IF:   After the first few days, you feel you are getting worse.  You have questions about your medicine.  You have chills, shortness of breath, or brown or red spit (mucus).  You have yellow or brown snot (nasal discharge) or pain in the face, especially when you bend forward.  You have a fever, puffy (swollen) neck, pain when you swallow, or white spots in the back of your throat.  You have a bad headache, ear pain, sinus pain, or chest pain.  You have a high-pitched whistling sound when you breathe in and out (wheezing).  You have a lasting cough or cough up blood.  You have sore muscles or a stiff neck. MAKE SURE YOU:   Understand these instructions.  Will watch your condition.  Will get help right away if you are not doing well or get worse. Document Released: 07/27/2007 Document Revised: 05/02/2011 Document Reviewed: 06/14/2010 [facility] Patient Information 2013 [place], [place].

## 2012-04-02 NOTE — Progress Notes (Signed)
  Subjective:    Patient ID: Alexis Small, female    DOB: [birth year], 72 y.o.   MRN: [id number]  HPI Patient is a 72 yo female who presents to the clinic with sinus pressure, drainage, cough. She started feeling bad last Thursday about 5 days ago. She had a lot of sinus and nasal pressure and drainage. Mucus was yellow. She started to feel like it was getting better and then she started having a lot of drainage and coughing all the time. Denies any SOB or wheezing. Has not had any fever. Tried Benadryl,advil and nyquil and did help some. Is not having any muscle aches. Denies n/v/d. Continues to eat and drink. She did watch a little girl with runny nose at church on Sunday.  She is also having some scaliness and itchiness in her ear and scalp. She does wear a hearing aid and seems to aggravate it. She cleans hearing aid with alcohol but does not make the itchiness or scaliness worse. Has not tried anything else to make better. Nothing seems to make worse.     Review of Systems     Objective:   Physical Exam  Constitutional: She is oriented to person, place, and time. She appears well-developed and well-nourished.  HENT:  Head: Normocephalic and atraumatic.  Mouth/Throat: Oropharynx is clear and moist.   Fine scales of both ears. TM's clears.   Some scales around scalp line behind ear.   Negative for maxillary or frontal tenderness.   Turbinates enlarged and red.   Eyes: Conjunctivae are normal. Right eye exhibits no discharge. Left eye exhibits no discharge.  Neck: Normal range of motion. Neck supple.  Cardiovascular: Normal rate, regular rhythm and normal heart sounds.   Pulmonary/Chest: Effort normal and breath sounds normal. She has no wheezes.  Lymphadenopathy:    She has no cervical adenopathy.  Neurological: She is alert and oriented to person, place, and time.  Skin: Skin is warm and dry.  Psychiatric: She has a normal mood and affect. Her behavior is normal.           Assessment & Plan:  URI/cough- Reassured pt likely viral. Discussed Mucinex D, umcka cold care, honey and other symptomatic treatment. Gave cough syrup at night. If not improving printed zpak to start in next 48 hours if not improving. Discussed with patient that she is the most contagious at day 3 of sickness. She is at day 5 ok to try to go to work. Cover mouth when cough and wash hands frequently.  Seboherric dermatitis- Ear gave nizoral cream. Scalp sent shampoo to use as needed up to once daily.Call if not improving.

## 2013-01-16 ENCOUNTER — Ambulatory Visit (INDEPENDENT_AMBULATORY_CARE_PROVIDER_SITE_OTHER): Payer: Medicare Other | Admitting: Family Medicine

## 2013-01-16 ENCOUNTER — Encounter: Payer: Self-pay | Admitting: Family Medicine

## 2013-01-16 VITALS — BP 130/74 | HR 69 | Wt 145.0 lb

## 2013-01-16 DIAGNOSIS — L239 Allergic contact dermatitis, unspecified cause: Secondary | ICD-10-CM

## 2013-01-16 DIAGNOSIS — L259 Unspecified contact dermatitis, unspecified cause: Secondary | ICD-10-CM

## 2013-01-16 MED ORDER — PREDNISONE 20 MG PO TABS: ORAL_TABLET | ORAL | Status: AC

## 2013-01-16 MED ORDER — HYDROXYZINE HCL 50 MG PO TABS: 50.0000 mg | ORAL_TABLET | Freq: Three times a day (TID) | ORAL | Status: DC | PRN

## 2013-01-16 NOTE — Progress Notes (Signed)
CC: Alexis Small is a 72 y.o. female is here for rash on chest   Subjective: HPI:  Small bumps and redness affecting the anterior chest and anterior neck has been present for 2 days. She's had similar episodes twice since the summer that last about a week resolved without intervention. The location has a moderate to severe itching sensation but no other sensation. She has tried hydrocortisone creams which made the itching turn into a burning sensation did not help symptoms at all. She denies any change of personal care products, exposure to metals, or sun exposure. Denies fevers, chills, shortness of breath or flushing   Review Of Systems Outlined In HPI  Past Medical History  Diagnosis Date  . Shingles      Family History  Problem Relation Age of Onset  . Aneurysm Mother 78    cerebral  . Birth defects Neg Hx   . Coronary artery disease Sister 62    deceased age 49  . COPD Sister     smoker  . Lung cancer Sister      History  Substance Use Topics  . Smoking status: Never Smoker   . Smokeless tobacco: Not on file  . Alcohol Use: No     Objective: Filed Vitals:   01/16/13 1319  BP: 130/74  Pulse: 69    General: Alert and Oriented, No Acute Distress HEENT: Pupils equal, round, reactive to light. Conjunctivae clear.  Moist membranes, pharynx unremarkable Lungs clear and comfortable work of breathing Cardiac: Regular rate and rhythm.  Mental Status: No depression, anxiety, nor agitation. Skin: Warm and dry. Proximal collarbones and sternum up until just below her chin there is a macular papular eruption that is confluence with moderate erythema  Assessment & Plan: Alexis Small was seen today for rash on chest.  Diagnoses and associated orders for this visit:  Allergic dermatitis - predniSONE (DELTASONE) 20 MG tablet; Three tabs daily days 1-3, two tabs daily days 4-6, one tab daily days 7-9, half tab daily days 10-13. - hydrOXYzine (ATARAX/VISTARIL) 50 MG  tablet; Take 1 tablet (50 mg total) by mouth 3 (three) times daily as needed for itching.    Appears to be suffering from allergic dermatitis, unknown trigger, I've asked her to think hard about different exposures or ingestions that may have contributed to this to prevent future occurrences. Start Benadryl as needed for itch, start prednisone taper for Resolution of rash and itching. Hydroxyzine if Benadryl is not helping   Return if symptoms worsen or fail to improve.

## 2013-02-04 ENCOUNTER — Ambulatory Visit (INDEPENDENT_AMBULATORY_CARE_PROVIDER_SITE_OTHER): Payer: Medicare Other | Admitting: Family Medicine

## 2013-02-04 ENCOUNTER — Encounter: Payer: Self-pay | Admitting: Family Medicine

## 2013-02-04 VITALS — BP 134/71 | HR 77 | Temp 97.6°F | Wt 146.0 lb

## 2013-02-04 DIAGNOSIS — Z0000 Encounter for general adult medical examination without abnormal findings: Secondary | ICD-10-CM

## 2013-02-04 DIAGNOSIS — E789 Disorder of lipoprotein metabolism, unspecified: Secondary | ICD-10-CM

## 2013-02-04 NOTE — Patient Instructions (Signed)
Keep up a regular exercise program and make sure you are eating a healthy diet Try to eat 4 servings of dairy a day, or if you are lactose intolerant take a calcium with vitamin D daily.  Your vaccines are up to date.   Advance Directive Advance directives are the legal documents that allow you to make choices about your health care and medical treatment if you cannot speak for yourself. Advance directives are a way for you to communicate your wishes to family, friends, and health care providers. The specified people can then convey your decisions about end-of-life care to avoid confusion if you should become unable to communicate. Ideally, the process of discussing and writing advance directives should be discussed over time rather than making decisions all at once. Advance directives can be modified as your situation changes and you can change your mind at any time even after you have signed the advance directives. Each state has its own laws regarding advance directives. You may want to check with your health care provider, attorney, or state representative about the law in your state. Below are some examples of advance directives. LIVING WILL A living will is a set of instructions documenting your wishes about medical care when you cannot care for yourself. It is used if you become:  Terminally ill.  Incapacitated.  Unable to communicate.  Unable to make decisions. Items to consider in your living will include:  The use or non-use of life-sustaining equipment, such as dialysis machines and breathing machines (ventilators).  A "do not resuscitate" (DNR) order, which is the instruction not to use CPR if breathing or heartbeat stops.  Tube feeding.  Withholding of food and fluids.  Comfort (palliative) care when the goal becomes comfort rather than a cure.  Organ and tissue donation. A living does not give instructions about distribution of your money and property if you should pass  away. It is advisable to seek the expert advice of a lawyer in drawing up a will regarding your possessions. Decisions about taxes, beneficiaries, and asset distribution will be legally binding. This process can relieve your family and friends of any burdens surrounding disputes or questions that may come up about the allocation of your assets. DO NOT RESUSCITATE (DNR) A do not resuscitate (DNR) order is a request to not have cardiopulmonary resuscitation (CPR) in the event that your heart stops or you stop breathing. Unless given other instructions, a health care provider will try to help any patient whose heart has stopped or who has stopped breathing.  HEALTHCARE PROXY AND DURABLE POWER OF ATTORNEY FOR HEALTH CARE A health care proxy is a person (agent) appointed to make medical decisions for you if you cannot. Generally, people choose someone they know well and trust to represent their preferences when they can no longer do so. You should be sure to ask this person for agreement to act as your agent. An agent may have to exercise judgment in the event of a medical decision for which your wishes are not known. The durable power of attorney for health care is the legal document that names your health care proxy. Once written, it should be:  Signed.  Notarized.  Dated.  Copied.  Witnessed.  Incorporated into your medical record. You may also want to appoint someone to manage your financial affairs if you cannot. This is called a durable power of attorney for finances. It is a separate legal document from the durable power of attorney for health care. You  may choose the same person or someone different from your health care proxy to act as your agent in financial matters. Document Released: 05/17/2007 Document Revised: 10/10/2012 Document Reviewed: 06/27/2012 [facility] Patient Information 2014 [place], [place].

## 2013-02-04 NOTE — Progress Notes (Signed)
Subjective:    Alexis Small is a 72 y.o. female who presents for Medicare Annual/Subsequent preventive examination.  Preventive Screening-Counseling & Management  Tobacco History  Smoking status  . Never Smoker   Smokeless tobacco  . Not on file     Problems Prior to Visit 1. None, has been working full time.   Current Problems (verified) Patient Active Problem List   Diagnosis Date Noted  . FUCH'S DYSTROPHY 09/19/2008  . HEADACHE, CHRONIC 09/19/2008  . CARCINOMA, SKIN, SQUAMOUS CELL 03/05/2008  . OSTEOPOROSIS 03/05/2008    Medications Prior to Visit No current outpatient prescriptions on file prior to visit.   No current facility-administered medications on file prior to visit.    Current Medications (verified) No current outpatient prescriptions on file.   No current facility-administered medications for this visit.     Allergies (verified) Review of patient's allergies indicates no known allergies.   PAST HISTORY  Family History Family History  Problem Relation Age of Onset  . Aneurysm Mother 61    cerebral  . Birth defects Neg Hx   . Coronary artery disease Sister 72    deceased age 73  . COPD Sister     smoker  . Lung cancer Sister     Social History History  Substance Use Topics  . Smoking status: Never Smoker   . Smokeless tobacco: Not on file  . Alcohol Use: No     Are there smokers in your home (other than you)? No  Risk Factors Current exercise habits: The patient does not participate in regular exercise at present.  Dietary issues discussed: none   Cardiac risk factors: advanced age (older than 22 for men, 61 for women), obesity (BMI >= 30 kg/m2) and sedentary lifestyle.  Depression Screen (Note: if answer to either of the following is "Yes", a more complete depression screening is indicated)   Over the past two weeks, have you felt down, depressed or hopeless? No  Over the past two weeks, have you felt little interest or  pleasure in doing things? No  Have you lost interest or pleasure in daily life? No  Do you often feel hopeless? No  Do you cry easily over simple problems? No  Activities of Daily Living In your present state of health, do you have any difficulty performing the following activities?:  Driving? No Managing money?  No Feeding yourself? No Getting from bed to chair? No Climbing a flight of stairs? No Preparing food and eating?: No Bathing or showering? No Getting dressed: No Getting to the toilet? No Using the toilet:No Moving around from place to place: No In the past year have you fallen or had a near fall?:No   Are you sexually active?  No  Do you have more than one partner?  No  Hearing Difficulties: No Do you often ask people to speak up or repeat themselves? No Do you experience ringing or noises in your ears? Yes Do you have difficulty understanding soft or whispered voices? No   Do you feel that you have a problem with memory? No  Do you often misplace items? No  Do you feel safe at home?  No  Cognitive Testing  Alert? Yes  Normal Appearance?Yes  Oriented to person? Yes  Place? Yes   Time? Yes  Recall of three objects?  Yes  Can perform simple calculations? Yes  Displays appropriate judgment?Yes  Can read the correct time from a watch face?Yes   Advanced Directives have been discussed  with the patient? Yes  List the Names of Other Physician/Practitioners you currently use: 1.  Dr. Terri Piedra - Dermatology  Indicate any recent Medical Services you may have received from other than [facility] providers in the past year (date may be approximate).  Immunization History  Administered Date(s) Administered  . Pneumococcal Polysaccharide-23 12/15/2011  . Tdap 09/14/2010  . Zoster 09/14/2010    Screening Tests Health Maintenance  Topic Date Due  . Influenza Vaccine  02/20/2014  . Mammogram  01/05/2014  . Colonoscopy  09/13/2020  . Tetanus/tdap  09/13/2020  .  Pneumococcal Polysaccharide Vaccine Age 72 And Over  Completed  . Zostavax  Completed    All answers were reviewed with the patient and necessary referrals were made:  METHENEY,CATHERINE, MD   02/04/2013   History reviewed: allergies, current medications, past family history, past medical history, past social history, past surgical history and problem list  Review of Systems A comprehensive review of systems was negative.    Objective:     Vision by Snellen chart: Had eye exam about 3 months ago  Body mass index is 27.6 kg/(m^2). BP 134/71  Pulse 77  Temp(Src) 97.6 F (36.4 C)  Wt 146 lb (66.225 kg)  BP 134/71  Pulse 77  Temp(Src) 97.6 F (36.4 C)  Wt 146 lb (66.225 kg)  General Appearance:    Alert, cooperative, no distress, appears stated age  Head:    Normocephalic, without obvious abnormality, atraumatic  Eyes:    PERRL, conjunctiva/corneas clear, EOM's intact, both eyes  Ears:    Normal TM's and external ear canals, both ears  Nose:   Nares normal, septum midline, mucosa normal, no drainage    or sinus tenderness  Throat:   Lips, mucosa, and tongue normal; teeth and gums normal  Neck:   Supple, symmetrical, trachea midline, no adenopathy;    thyroid:  no enlargement/tenderness/nodules; no carotid   bruit or JVD  Back:     Symmetric, no curvature, ROM normal, no CVA tenderness  Lungs:     Clear to auscultation bilaterally, respirations unlabored  Chest Wall:    No tenderness or deformity   Heart:    Regular rate and rhythm, S1 and S2 normal, no murmur, rub   or gallop  Breast Exam:    No tenderness, masses, or nipple abnormality  Abdomen:     Soft, non-tender, bowel sounds active all four quadrants,    no masses, no organomegaly  Genitalia:    Not performed.   Rectal:    Not performed  Extremities:   Extremities normal, atraumatic, no cyanosis or edema  Pulses:   2+ and symmetric all extremities  Skin:   Skin color, texture, turgor normal, no rashes or lesions   Lymph nodes:   Cervical, supraclavicular, and axillary nodes normal  Neurologic:   CNII-XII intact, normal strength, sensation and reflexes    throughout       Assessment:    Annual Medicare Wellness Exam      Plan:     During the course of the visit the patient was educated and counseled about appropriate screening and preventive services including:    Influenza vaccine - declined.   Mammogram scheduled for next week  CMP and lipids  Diet review for nutrition referral? Yes ____  Not Indicated _X_   Patient Instructions (the written plan) was given to the patient.  Medicare Attestation I have personally reviewed: The patient's medical and social history Their use of alcohol, tobacco or illicit  drugs Their current medications and supplements The patient's functional ability including ADLs,fall risks, home safety risks, cognitive, and hearing and visual impairment Diet and physical activities Evidence for depression or mood disorders  The patient's weight, height, BMI, and visual acuity have been recorded in the chart.  I have made referrals, counseling, and provided education to the patient based on review of the above and I have provided the patient with a written personalized care plan for preventive services.     METHENEY,CATHERINE, MD   02/04/2013

## 2013-02-07 LAB — COMPLETE METABOLIC PANEL WITH GFR
Albumin: 4.2 g/dL (ref 3.5–5.2)
BUN: 16 mg/dL (ref 6–23)
CO2: 26 mEq/L (ref 19–32)
Calcium: 9.5 mg/dL (ref 8.4–10.5)
Chloride: 103 mEq/L (ref 96–112)
Creat: 0.75 mg/dL (ref 0.50–1.10)
GFR, Est African American: >89 mL/min
GFR, Est Non African American: 80 mL/min
Glucose, Bld: 101 mg/dL — ABNORMAL HIGH (ref 70–99)
Potassium: 4.2 mEq/L (ref 3.5–5.3)

## 2013-02-07 LAB — LIPID PANEL
Cholesterol: 197 mg/dL (ref 0–200)
VLDL: 20 mg/dL (ref 0–40)

## 2013-02-08 NOTE — Progress Notes (Signed)
Quick Note:  All labs are normal. ______ 

## 2013-03-12 ENCOUNTER — Encounter: Payer: Self-pay | Admitting: Family Medicine

## 2014-02-05 ENCOUNTER — Encounter: Payer: Self-pay | Admitting: Family Medicine

## 2014-02-05 ENCOUNTER — Ambulatory Visit (INDEPENDENT_AMBULATORY_CARE_PROVIDER_SITE_OTHER): Payer: Medicare Other | Admitting: Family Medicine

## 2014-02-05 ENCOUNTER — Telehealth: Payer: Self-pay | Admitting: Family Medicine

## 2014-02-05 VITALS — BP 155/75 | HR 70 | Temp 98.1°F | Ht 61.0 in | Wt 145.0 lb

## 2014-02-05 DIAGNOSIS — Z974 Presence of external hearing-aid: Secondary | ICD-10-CM | POA: Insufficient documentation

## 2014-02-05 DIAGNOSIS — Z13220 Encounter for screening for lipoid disorders: Secondary | ICD-10-CM

## 2014-02-05 DIAGNOSIS — R252 Cramp and spasm: Secondary | ICD-10-CM

## 2014-02-05 DIAGNOSIS — Z0000 Encounter for general adult medical examination without abnormal findings: Secondary | ICD-10-CM

## 2014-02-05 DIAGNOSIS — Z13820 Encounter for screening for osteoporosis: Secondary | ICD-10-CM

## 2014-02-05 NOTE — Progress Notes (Signed)
Subjective:    Alexis Small is a 73 y.o. female who presents for Medicare Annual/Subsequent preventive examination.  Preventive Screening-Counseling & Management  Tobacco History  Smoking status  . Never Smoker   Smokeless tobacco  . Not on file     Problems Prior to Visit 1.   Current Problems (verified) Patient Active Problem List   Diagnosis Date Noted  . FUCH'S DYSTROPHY 09/19/2008  . HEADACHE, CHRONIC 09/19/2008  . CARCINOMA, SKIN, SQUAMOUS CELL 03/05/2008  . OSTEOPOROSIS 03/05/2008    Medications Prior to Visit No current outpatient prescriptions on file prior to visit.   No current facility-administered medications on file prior to visit.    Current Medications (verified) No current outpatient prescriptions on file.   No current facility-administered medications for this visit.     Allergies (verified) Prednisone   PAST HISTORY  Family History Family History  Problem Relation Age of Onset  . Aneurysm Mother 67    cerebral  . Birth defects Neg Hx   . Coronary artery disease Sister 36    deceased age 3  . COPD Sister     smoker  . Lung cancer Sister     Social History History  Substance Use Topics  . Smoking status: Never Smoker   . Smokeless tobacco: Not on file  . Alcohol Use: No     Are there smokers in your home (other than you)? No  Risk Factors Current exercise habits: woks out at gym about 3 days per week for about 45-60 min  Dietary issues discussed: None   Cardiac risk factors: advanced age (older than 70 for men, 68 for women).  Depression Screen (Note: if answer to either of the following is "Yes", a more complete depression screening is indicated)   Over the past two weeks, have you felt down, depressed or hopeless? No  Over the past two weeks, have you felt little interest or pleasure in doing things? No  Have you lost interest or pleasure in daily life? No  Do you often feel hopeless? No  Do you cry easily over  simple problems? No  Activities of Daily Living In your present state of health, do you have any difficulty performing the following activities?:  Driving? No Managing money?  No Feeding yourself? No Getting from bed to chair? No  Climbing a flight of stairs? No Preparing food and eating?: No Bathing or showering? No Getting dressed: No Getting to the toilet? No Using the toilet:No Moving around from place to place: No In the past year have you fallen or had a near fall?:No   Are you sexually active?  No  Do you have more than one partner?  No  Hearing Difficulties: Yes Do you often ask people to speak up or repeat themselves? Yes Do you experience ringing or noises in your ears? No Do you have difficulty understanding soft or whispered voices? Yes   Do you feel that you have a problem with memory? No  Do you often misplace items? No  Do you feel safe at home?  Yes  Cognitive Testing  Alert? Yes  Normal Appearance?Yes  Oriented to person? Yes  Place? Yes   Time? Yes  Recall of three objects?  Yes  Can perform simple calculations? Yes  Displays appropriate judgment?Yes  Can read the correct time from a watch face?Yes   6 CIT score of 0/28 ( normal)     Advanced Directives have been discussed with the patient? Yes  List  the Names of Other Physician/Practitioners you currently use: 1.  Dermatology  Indicate any recent Medical Services you may have received from other than [facility] providers in the past year (date may be approximate).  Immunization History  Administered Date(s) Administered  . Pneumococcal Polysaccharide-23 12/15/2011  . Tdap 09/14/2010  . Zoster 09/14/2010    Screening Tests Health Maintenance  Topic Date Due  . INFLUENZA VACCINE  02/20/2014 (Originally 09/21/2013)  . MAMMOGRAM  02/07/2015  . COLONOSCOPY  09/13/2020  . TETANUS/TDAP  09/13/2020  . DEXA SCAN  Completed  . PNEUMOCOCCAL POLYSACCHARIDE VACCINE AGE 10 AND OVER  Completed  . ZOSTAVAX   Completed    All answers were reviewed with the patient and necessary referrals were made:  Llewellyn Schoenberger, MD   02/05/2014   History reviewed: allergies, current medications, past family history, past medical history, past social history, past surgical history and problem list  Review of Systems A comprehensive review of systems was negative.    Objective:     Vision by Snellen chart:she repot eye exam is UTD.    Body mass index is 27.41 kg/(m^2). BP 155/75 mmHg  Pulse 70  Temp(Src) 98.1 F (36.7 C) (Oral)  Ht 5\' 1"  (1.549 m)  Wt 145 lb (65.772 kg)  BMI 27.41 kg/m2  SpO2 99%  BP 155/75 mmHg  Pulse 70  Temp(Src) 98.1 F (36.7 C) (Oral)  Ht 5\' 1"  (1.549 m)  Wt 145 lb (65.772 kg)  BMI 27.41 kg/m2  SpO2 99%  General Appearance:    Alert, cooperative, no distress, appears stated age  Head:    Normocephalic, without obvious abnormality, atraumatic  Eyes:    PERRL, conjunctiva/corneas clear, EOM's intact, both eyes  Ears:    Normal TM's and external ear canals, both ears  Nose:   Nares normal, septum midline, mucosa normal, no drainage    or sinus tenderness  Throat:   Lips, mucosa, and tongue normal; teeth and gums normal  Neck:   Supple, symmetrical, trachea midline, no adenopathy;    thyroid:  no enlargement/tenderness/nodules; no carotid   bruit or JVD  Back:     Symmetric, no curvature, ROM normal, no CVA tenderness  Lungs:     Clear to auscultation bilaterally, respirations unlabored  Chest Wall:    No tenderness or deformity   Heart:    Regular rate and rhythm, S1 and S2 normal, no murmur, rub   or gallop  Breast Exam:    Not performed   Abdomen:     Soft, non-tender, bowel sounds active all four quadrants,    no masses, no organomegaly  Genitalia:    Not performed   Rectal:    Not performed  Extremities:   Extremities normal, atraumatic, no cyanosis or edema  Pulses:   2+ and symmetric all extremities  Skin:   Skin color, texture, turgor normal, no rashes  or lesions  Lymph nodes:   Cervical, supraclavicular nodes are normal  Neurologic:   CNII-XII intact, normal strength, reflexes symmetric   throughout        Assessment:     Medicare Wellness Exam       Plan:     During the course of the visit the patient was educated and counseled about appropriate screening and preventive services including:   Pneumococcal vaccine   - discussed Prevnar 13 today.she wants to think about it.  Mammogram due later this month Declined flu vaccine Due for repeat bone density   Diet review for nutrition referral?  Yes ____  Not Indicated _X__   Patient Instructions (the written plan) was given to the patient.  Medicare Attestation I have personally reviewed: The patient's medical and social history Their use of alcohol, tobacco or illicit drugs Their current medications and supplements The patient's functional ability including ADLs,fall risks, home safety risks, cognitive, and hearing and visual impairment Diet and physical activities Evidence for depression or mood disorders  The patient's weight, height, BMI, and visual acuity have been recorded in the chart.  I have made referrals, counseling, and provided education to the patient based on review of the above and I have provided the patient with a written personalized care plan for preventive services.     Avonelle Viveros, MD   02/05/2014

## 2014-02-05 NOTE — Telephone Encounter (Signed)
Please call patient: She is due for bone density test. It has been 2 years. If she would like for [place] to schedule this for her then please let me know. She did have some evidence of mildly thin bones back in 2013.

## 2014-02-05 NOTE — Telephone Encounter (Signed)
Pt informed and is ok with having this done.Alexis Small [place]

## 2014-02-07 ENCOUNTER — Telehealth: Payer: Self-pay | Admitting: Family Medicine

## 2014-02-07 LAB — CK: Total CK: 42 U/L (ref 7–177)

## 2014-02-07 LAB — CBC WITH DIFFERENTIAL/PLATELET
BASOS ABS: 0.1 10*3/uL (ref 0.0–0.1)
BASOS PCT: 1 % (ref 0–1)
EOS PCT: 5 % (ref 0–5)
Eosinophils Absolute: 0.3 10*3/uL (ref 0.0–0.7)
HCT: 42.7 % (ref 36.0–46.0)
Hemoglobin: 14.1 g/dL (ref 12.0–15.0)
Lymphocytes Relative: 33 % (ref 12–46)
Lymphs Abs: 1.8 10*3/uL (ref 0.7–4.0)
MCH: 29.4 pg (ref 26.0–34.0)
MCHC: 33 g/dL (ref 30.0–36.0)
MCV: 89.1 fL (ref 78.0–100.0)
MONO ABS: 0.4 10*3/uL (ref 0.1–1.0)
MPV: 10.9 fL (ref 9.4–12.4)
Monocytes Relative: 8 % (ref 3–12)
Neutro Abs: 3 10*3/uL (ref 1.7–7.7)
Neutrophils Relative %: 53 % (ref 43–77)
PLATELETS: 219 10*3/uL (ref 150–400)
RBC: 4.79 MIL/uL (ref 3.87–5.11)
RDW: 14 % (ref 11.5–15.5)
WBC: 5.6 10*3/uL (ref 4.0–10.5)

## 2014-02-07 LAB — COMPLETE METABOLIC PANEL WITH GFR
ALBUMIN: 4.1 g/dL (ref 3.5–5.2)
ALT: 11 U/L (ref 0–35)
AST: 15 U/L (ref 0–37)
Alkaline Phosphatase: 75 U/L (ref 39–117)
BILIRUBIN TOTAL: 0.7 mg/dL (ref 0.2–1.2)
BUN: 18 mg/dL (ref 6–23)
CALCIUM: 9.6 mg/dL (ref 8.4–10.5)
CO2: 27 meq/L (ref 19–32)
Chloride: 104 mEq/L (ref 96–112)
Creat: 0.74 mg/dL (ref 0.50–1.10)
GFR, EST NON AFRICAN AMERICAN: 81 mL/min
GLUCOSE: 89 mg/dL (ref 70–99)
POTASSIUM: 4.5 meq/L (ref 3.5–5.3)
Sodium: 137 mEq/L (ref 135–145)
TOTAL PROTEIN: 7.1 g/dL (ref 6.0–8.3)

## 2014-02-07 LAB — LIPID PANEL
Cholesterol: 170 mg/dL (ref 0–200)
HDL: 89 mg/dL (ref >39)
LDL Cholesterol: 65 mg/dL (ref 0–99)
Total CHOL/HDL Ratio: 1.9 Ratio
Triglycerides: 82 mg/dL (ref <150)
VLDL: 16 mg/dL (ref 0–40)

## 2014-02-07 LAB — TSH: TSH: 1.963 u[IU]/mL (ref 0.350–4.500)

## 2014-02-07 NOTE — Telephone Encounter (Signed)
Patient walked-in advised she seen Dr. Madilyn Fireman this week and discussed using a cream. Pt adv Dr. Jerilynn Mages that she had a cream at home and wanted to know if she could use that particular cream. The name of the cream is called Desoximetasone Cream would like to get a phone call back to be advised if cream is okay to use. Thanks

## 2014-02-10 NOTE — Telephone Encounter (Signed)
Left detailed message.   

## 2014-02-10 NOTE — Telephone Encounter (Signed)
Ok to use cream at home.  If not helping after 1-2 weeks then followup

## 2014-02-27 DIAGNOSIS — H18451 Nodular corneal degeneration, right eye: Secondary | ICD-10-CM | POA: Diagnosis not present

## 2014-07-02 ENCOUNTER — Other Ambulatory Visit: Payer: Self-pay | Admitting: *Deleted

## 2014-07-02 ENCOUNTER — Telehealth: Payer: Self-pay | Admitting: *Deleted

## 2014-07-02 DIAGNOSIS — Z1231 Encounter for screening mammogram for malignant neoplasm of breast: Secondary | ICD-10-CM

## 2014-07-02 DIAGNOSIS — M810 Age-related osteoporosis without current pathological fracture: Secondary | ICD-10-CM

## 2014-07-02 NOTE — Telephone Encounter (Signed)
Orders faxed.Jolyssa Oplinger [place]  

## 2014-09-08 DIAGNOSIS — D225 Melanocytic nevi of trunk: Secondary | ICD-10-CM | POA: Diagnosis not present

## 2014-09-08 DIAGNOSIS — Z85828 Personal history of other malignant neoplasm of skin: Secondary | ICD-10-CM | POA: Diagnosis not present

## 2014-09-08 DIAGNOSIS — Z08 Encounter for follow-up examination after completed treatment for malignant neoplasm: Secondary | ICD-10-CM | POA: Diagnosis not present

## 2014-09-08 DIAGNOSIS — L814 Other melanin hyperpigmentation: Secondary | ICD-10-CM | POA: Diagnosis not present

## 2014-09-12 DIAGNOSIS — H47393 Other disorders of optic disc, bilateral: Secondary | ICD-10-CM | POA: Diagnosis not present

## 2014-09-12 DIAGNOSIS — H2513 Age-related nuclear cataract, bilateral: Secondary | ICD-10-CM | POA: Diagnosis not present

## 2014-09-12 LAB — HM DIABETES EYE EXAM

## 2014-09-17 ENCOUNTER — Encounter: Payer: Self-pay | Admitting: Family Medicine

## 2014-09-29 DIAGNOSIS — Z1231 Encounter for screening mammogram for malignant neoplasm of breast: Secondary | ICD-10-CM | POA: Diagnosis not present

## 2014-10-08 ENCOUNTER — Encounter: Payer: Self-pay | Admitting: Family Medicine

## 2014-10-20 DIAGNOSIS — M542 Cervicalgia: Secondary | ICD-10-CM | POA: Diagnosis not present

## 2014-10-22 ENCOUNTER — Ambulatory Visit: Payer: Self-pay | Admitting: *Deleted

## 2014-10-29 DIAGNOSIS — M6281 Muscle weakness (generalized): Secondary | ICD-10-CM | POA: Diagnosis not present

## 2014-10-29 DIAGNOSIS — M546 Pain in thoracic spine: Secondary | ICD-10-CM | POA: Diagnosis not present

## 2014-10-29 DIAGNOSIS — M542 Cervicalgia: Secondary | ICD-10-CM | POA: Diagnosis not present

## 2014-11-04 DIAGNOSIS — M542 Cervicalgia: Secondary | ICD-10-CM | POA: Diagnosis not present

## 2014-11-04 DIAGNOSIS — M6281 Muscle weakness (generalized): Secondary | ICD-10-CM | POA: Diagnosis not present

## 2014-11-04 DIAGNOSIS — M546 Pain in thoracic spine: Secondary | ICD-10-CM | POA: Diagnosis not present

## 2014-11-10 DIAGNOSIS — M546 Pain in thoracic spine: Secondary | ICD-10-CM | POA: Diagnosis not present

## 2014-11-10 DIAGNOSIS — M542 Cervicalgia: Secondary | ICD-10-CM | POA: Diagnosis not present

## 2014-11-10 DIAGNOSIS — M6281 Muscle weakness (generalized): Secondary | ICD-10-CM | POA: Diagnosis not present

## 2014-11-22 HISTORY — PX: CORNEAL TRANSPLANT: SHX108

## 2014-12-15 DIAGNOSIS — Z885 Allergy status to narcotic agent status: Secondary | ICD-10-CM | POA: Diagnosis not present

## 2014-12-15 DIAGNOSIS — H2512 Age-related nuclear cataract, left eye: Secondary | ICD-10-CM | POA: Diagnosis not present

## 2014-12-15 DIAGNOSIS — H1851 Endothelial corneal dystrophy: Secondary | ICD-10-CM | POA: Diagnosis not present

## 2015-03-08 LAB — HM DIABETES EYE EXAM

## 2015-06-04 DIAGNOSIS — H2513 Age-related nuclear cataract, bilateral: Secondary | ICD-10-CM | POA: Diagnosis not present

## 2015-06-04 DIAGNOSIS — Z9842 Cataract extraction status, left eye: Secondary | ICD-10-CM | POA: Diagnosis not present

## 2015-06-04 DIAGNOSIS — H1851 Endothelial corneal dystrophy: Secondary | ICD-10-CM | POA: Diagnosis not present

## 2015-06-04 DIAGNOSIS — Z961 Presence of intraocular lens: Secondary | ICD-10-CM | POA: Diagnosis not present

## 2015-06-04 DIAGNOSIS — Z947 Corneal transplant status: Secondary | ICD-10-CM | POA: Diagnosis not present

## 2015-07-15 DIAGNOSIS — L570 Actinic keratosis: Secondary | ICD-10-CM | POA: Diagnosis not present

## 2015-08-28 DIAGNOSIS — Z961 Presence of intraocular lens: Secondary | ICD-10-CM | POA: Diagnosis not present

## 2015-08-28 DIAGNOSIS — Z9842 Cataract extraction status, left eye: Secondary | ICD-10-CM | POA: Diagnosis not present

## 2015-08-28 DIAGNOSIS — H1851 Endothelial corneal dystrophy: Secondary | ICD-10-CM | POA: Diagnosis not present

## 2015-08-28 DIAGNOSIS — Z947 Corneal transplant status: Secondary | ICD-10-CM | POA: Diagnosis not present

## 2015-09-10 ENCOUNTER — Ambulatory Visit (INDEPENDENT_AMBULATORY_CARE_PROVIDER_SITE_OTHER): Payer: Medicare Other

## 2015-09-10 ENCOUNTER — Encounter: Payer: Self-pay | Admitting: Sports Medicine

## 2015-09-10 ENCOUNTER — Ambulatory Visit (INDEPENDENT_AMBULATORY_CARE_PROVIDER_SITE_OTHER): Payer: Medicare Other | Admitting: Sports Medicine

## 2015-09-10 VITALS — BP 123/70 | HR 84 | Resp 18 | Wt 147.9 lb

## 2015-09-10 DIAGNOSIS — M2012 Hallux valgus (acquired), left foot: Secondary | ICD-10-CM | POA: Diagnosis not present

## 2015-09-10 DIAGNOSIS — M79672 Pain in left foot: Secondary | ICD-10-CM

## 2015-09-10 DIAGNOSIS — M79675 Pain in left toe(s): Secondary | ICD-10-CM | POA: Insufficient documentation

## 2015-09-10 DIAGNOSIS — M19072 Primary osteoarthritis, left ankle and foot: Secondary | ICD-10-CM | POA: Diagnosis not present

## 2015-09-10 NOTE — Progress Notes (Signed)
   Subjective:    I'm seeing this patient as a consultation for:  Dr. Beatrice Lecher  CC: Severe left foot pain  HPI: This is a pleasant 75 year old female, yesterday she got an abrasion over her right ankle, this is really not bothering her, however for the past several months she's had pain that she localizes on the dorsum of her left midfoot, with swelling, pain is severe with weightbearing and with palpation. No constitutional symptoms, no discrete trauma per patient's report.  Past medical history, Surgical history, Family history not pertinant except as noted below, Social history, Allergies, and medications have been entered into the medical record, reviewed, and no changes needed.   Review of Systems: No headache, visual changes, nausea, vomiting, diarrhea, constipation, dizziness, abdominal pain, skin rash, fevers, chills, night sweats, weight loss, swollen lymph nodes, body aches, joint swelling, muscle aches, chest pain, shortness of breath, mood changes, visual or auditory hallucinations.   Objective:   General: Well Developed, well nourished, and in no acute distress.  Neuro/Psych: Alert and oriented x3, extra-ocular muscles intact, able to move all 4 extremities, sensation grossly intact. Skin: Warm and dry, no rashes noted.  Respiratory: Not using accessory muscles, speaking in full sentences, trachea midline.  Cardiovascular: Pulses palpable, no extremity edema. Abdomen: Does not appear distended. Left Foot: Swelling and severe tenderness over the dorsal midfoot Range of motion is full in all directions. Strength is 5/5 in all directions. No hallux valgus. No pes pes cavus. No abnormal callus noted. No pain over the navicular prominence, or base of fifth metatarsal. No tenderness to palpation of the calcaneal insertion of plantar fascia. No pain at the Achilles insertion. No pain over the calcaneal bursa. No pain of the retrocalcaneal bursa. Swelling and severe  tenderness over the dorsal midfoot. No hallux rigidus or limitus. No tenderness palpation over interphalangeal joints. No pain with compression of the metatarsal heads. Neurovascularly intact distally. Small abrasion over the right medial ankle, no signs of bacterial superinfection or foreign body.  Impression and Recommendations:   This case required medical decision making of moderate complexity.

## 2015-09-10 NOTE — Assessment & Plan Note (Signed)
Likely degenerative synovitis of the midfoot. She also has significant pes cavus. Declines any type of pharmacologic intervention. CAM boot, return for custom orthotics, x-rays.  Return her to see me for orthotics, if persistent pain and swelling we will do an injection.

## 2015-09-15 ENCOUNTER — Encounter: Payer: Medicare Other | Admitting: Sports Medicine

## 2015-10-30 ENCOUNTER — Encounter: Payer: Self-pay | Admitting: Family Medicine

## 2015-10-30 ENCOUNTER — Ambulatory Visit (INDEPENDENT_AMBULATORY_CARE_PROVIDER_SITE_OTHER): Payer: Medicare Other | Admitting: Family Medicine

## 2015-10-30 VITALS — BP 138/65 | HR 69 | Wt 145.0 lb

## 2015-10-30 DIAGNOSIS — Z0000 Encounter for general adult medical examination without abnormal findings: Secondary | ICD-10-CM | POA: Diagnosis not present

## 2015-10-30 DIAGNOSIS — M8580 Other specified disorders of bone density and structure, unspecified site: Secondary | ICD-10-CM | POA: Diagnosis not present

## 2015-10-30 DIAGNOSIS — Z13220 Encounter for screening for lipoid disorders: Secondary | ICD-10-CM

## 2015-10-30 DIAGNOSIS — R7989 Other specified abnormal findings of blood chemistry: Secondary | ICD-10-CM

## 2015-10-30 NOTE — Patient Instructions (Signed)
Mammogram A mammogram is an X-ray of the breasts that is done to check for abnormal changes. This procedure can screen for and detect any changes that may suggest breast cancer. A mammogram can also identify other changes and variations in the breast, such as:  Inflammation of the breast tissue (mastitis).  An infected area that contains a collection of pus (abscess).  A fluid-filled sac (cyst).  Fibrocystic changes. This is when breast tissue becomes denser, which can make the tissue feel rope-like or uneven under the skin.  Tumors that are not cancerous (benign). LET YOUR HEALTH CARE PROVIDER KNOW ABOUT:  Any allergies you have.  If you have breast implants.  If you have had previous breast disease, biopsy, or surgery.  If you are breastfeeding.  Any possibility that you could be pregnant, if this applies.  If you are younger than age 25.  If you have a family history of breast cancer. RISKS AND COMPLICATIONS Generally, this is a safe procedure. However, problems may occur, including:  Exposure to radiation. Radiation levels are very low with this test.  The results being misinterpreted.  The need for further tests.  The inability of the mammogram to detect certain cancers. BEFORE THE PROCEDURE  Schedule your test about 1-2 weeks after your menstrual period. This is usually when your breasts are the least tender.  If you have had a mammogram done at a different facility in the past, get the mammogram X-rays or have them sent to your current exam facility in order to compare them.  Wash your breasts and under your arms the day of the test.  Do not wear deodorants, perfumes, lotions, or powders anywhere on your body on the day of the test.  Remove any jewelry from your neck.  Wear clothes that you can change into and out of easily. PROCEDURE  You will undress from the waist up and put on a gown.  You will stand in front of the X-ray machine.  Each breast will  be placed between two plastic or glass plates. The plates will compress your breast for a few seconds. Try to stay as relaxed as possible during the procedure. This does not cause any harm to your breasts and any discomfort you feel will be very brief.  X-rays will be taken from different angles of each breast. The procedure may vary among health care providers and hospitals. AFTER THE PROCEDURE  The mammogram will be examined by a specialist (radiologist).  You may need to repeat certain parts of the test, depending on the quality of the images. This is commonly done if the radiologist needs a better view of the breast tissue.  Ask when your test results will be ready. Make sure you get your test results.  You may resume your normal activities.   This information is not intended to replace advice given to you by your health care provider. Make sure you discuss any questions you have with your health care provider.   Document Released: 02/05/2000 Document Revised: 10/29/2014 Document Reviewed: 04/18/2014 [employer]vier Interactive Patient Education 2016 [employer]  

## 2015-10-30 NOTE — Progress Notes (Signed)
Subjective:   Alexis Small is a 75 y.o. female who presents for Medicare Annual (Subsequent) preventive examination.    She does yoga and walking for exercise. She has not seen any other physician except for [facility]. She had corneal replacement in October 2016 and has cortical he eye appointments.  Review of Systems:  Conference of review systems is negative       Objective:     Vitals: BP 138/65   Pulse 69   Wt 145 lb (65.8 kg)   SpO2 100%   BMI 27.40 kg/m   Body mass index is 27.4 kg/m.   Tobacco History  Smoking Status  . Never Smoker  Smokeless Tobacco  . Not on file     Counseling given: Not Answered   Past Medical History:  Diagnosis Date  . Shingles    Past Surgical History:  Procedure Laterality Date  . ABDOMINAL HYSTERECTOMY  1970's   Complete, 2x  . APPENDECTOMY  1970's  . BREAST SURGERY  1980's   reduction  . CORNEAL TRANSPLANT  11/2014   [facility]   Family History  Problem Relation Age of Onset  . Aneurysm Mother 5    cerebral  . Coronary artery disease Sister 66    deceased age 33  . COPD Sister     smoker  . Lung cancer Sister   . Birth defects Neg Hx    History  Sexual Activity  . 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    No outpatient encounter prescriptions on file as of 10/30/2015.   No facility-administered encounter medications on file as of 10/30/2015.     Activities of Daily Living In your present state of health, do you have any difficulty performing the following activities: 10/30/2015  Hearing? Y  Vision? N  Difficulty concentrating or making decisions? N  Walking or climbing stairs? N  Dressing or bathing? N  Doing errands, shopping? N  Some recent data might be hidden    Patient Care Team: Hali Marry, MD as PCP - General    Assessment:    Medicare wellness exam  Exercise Activities and Dietary  recommendations Current Exercise Habits: Structured exercise class, Type of exercise: yoga;walking  Goals    None     Fall Risk Fall Risk  10/30/2015 02/05/2014 02/04/2013 02/04/2013  Falls in the past year? No No No No   Depression Screen PHQ 2/9 Scores 10/30/2015 02/05/2014 02/04/2013 02/04/2013  PHQ - 2 Score 0 0 0 0     Cognitive Testing No flowsheet data found.  6 CIT score is normal.    Immunization History  Administered Date(s) Administered  . Pneumococcal Polysaccharide-23 12/15/2011  . Tdap 09/14/2010  . Zoster 09/14/2010   Screening Tests Health Maintenance  Topic Date Due  . PNA vac Low Risk Adult (2 of 2 - PCV13) 10/29/2016 (Originally 12/14/2012)  . INFLUENZA VACCINE  02/21/2019 (Originally 09/22/2015)  . COLONOSCOPY  09/13/2020  . TETANUS/TDAP  09/13/2020  . DEXA SCAN  Completed  . ZOSTAVAX  Completed      Plan:    Medicare Wellness Exam  During the course of the visit the patient was educated and counseled about the following appropriate screening and preventive services:   Declined vaccines including flu and Prevnar  Cardiovascular Disease  Colorectal cancer screening done July 2012.  Bone density screening - osteopenia. Due for repeat bone density.  Diabetes screening  Glaucoma screening  Mammography, last was August 2016.  Wears hearing aids.    Patient Instructions (the written plan) was given to the patient.   Hodge Stachnik, MD  10/30/2015

## 2015-10-31 LAB — LIPID PANEL
CHOL/HDL RATIO: 2 ratio (ref <=5.0)
Cholesterol: 196 mg/dL (ref 125–200)
HDL: 99 mg/dL (ref >=46)
LDL CALC: 78 mg/dL (ref <130)
Triglycerides: 94 mg/dL (ref <150)
VLDL: 19 mg/dL (ref <30)

## 2015-10-31 LAB — COMPLETE METABOLIC PANEL WITH GFR
ALBUMIN: 4.3 g/dL (ref 3.6–5.1)
ALT: 12 U/L (ref 6–29)
AST: 16 U/L (ref 10–35)
Alkaline Phosphatase: 71 U/L (ref 33–130)
BUN: 16 mg/dL (ref 7–25)
CHLORIDE: 104 mmol/L (ref 98–110)
CO2: 26 mmol/L (ref 20–31)
Calcium: 9.5 mg/dL (ref 8.6–10.4)
Creat: 0.8 mg/dL (ref 0.60–0.93)
GFR, EST NON AFRICAN AMERICAN: 72 mL/min (ref >=60)
GFR, Est African American: 83 mL/min (ref >=60)
GLUCOSE: 98 mg/dL (ref 65–99)
POTASSIUM: 4.8 mmol/L (ref 3.5–5.3)
SODIUM: 140 mmol/L (ref 135–146)
TOTAL PROTEIN: 7.5 g/dL (ref 6.1–8.1)
Total Bilirubin: 0.7 mg/dL (ref 0.2–1.2)

## 2015-10-31 LAB — VITAMIN D 25 HYDROXY (VIT D DEFICIENCY, FRACTURES): VIT D 25 HYDROXY: 24 ng/mL — AB (ref 30–100)

## 2015-11-02 NOTE — Addendum Note (Signed)
Addended by: Teddy Spike on: 11/02/2015 12:52 PM   Modules accepted: Orders

## 2015-11-13 ENCOUNTER — Encounter: Payer: Self-pay | Admitting: Family Medicine

## 2015-11-13 DIAGNOSIS — E559 Vitamin D deficiency, unspecified: Secondary | ICD-10-CM | POA: Insufficient documentation

## 2016-01-12 DIAGNOSIS — Z947 Corneal transplant status: Secondary | ICD-10-CM | POA: Diagnosis not present

## 2016-01-12 DIAGNOSIS — H1851 Endothelial corneal dystrophy: Secondary | ICD-10-CM | POA: Diagnosis not present

## 2016-02-22 DIAGNOSIS — J069 Acute upper respiratory infection, unspecified: Secondary | ICD-10-CM | POA: Diagnosis not present

## 2016-02-24 ENCOUNTER — Encounter: Payer: Self-pay | Admitting: Family Medicine

## 2016-02-24 ENCOUNTER — Ambulatory Visit (INDEPENDENT_AMBULATORY_CARE_PROVIDER_SITE_OTHER): Payer: Medicare Other | Admitting: Family Medicine

## 2016-02-24 VITALS — BP 146/67 | HR 88 | Temp 97.9°F | Ht 61.0 in | Wt 143.0 lb

## 2016-02-24 DIAGNOSIS — B9789 Other viral agents as the cause of diseases classified elsewhere: Secondary | ICD-10-CM | POA: Diagnosis not present

## 2016-02-24 DIAGNOSIS — J988 Other specified respiratory disorders: Secondary | ICD-10-CM

## 2016-02-24 NOTE — Patient Instructions (Addendum)
Viral Illness, Adult Viruses are tiny germs that can get into a person's body and cause illness. There are many different types of viruses, and they cause many types of illness. Viral illnesses can range from mild to severe. They can affect various parts of the body. Common illnesses that are caused by a virus include colds and the flu. Viral illnesses also include serious conditions such as HIV/AIDS (human immunodeficiency virus/acquired immunodeficiency syndrome). A few viruses have been linked to certain cancers. What are the causes? Many types of viruses can cause illness. Viruses invade cells in your body, multiply, and cause the infected cells to malfunction or die. When the cell dies, it releases more of the virus. When this happens, you develop symptoms of the illness, and the virus continues to spread to other cells. If the virus takes over the function of the cell, it can cause the cell to divide and grow out of control, as is the case when a virus causes cancer. Different viruses get into the body in different ways. You can get a virus by:  Swallowing food or water that is contaminated with the virus.  Breathing in droplets that have been coughed or sneezed into the air by an infected person.  Touching a surface that has been contaminated with the virus and then touching your eyes, nose, or mouth.  Being bitten by an insect or animal that carries the virus.  Having sexual contact with a person who is infected with the virus.  Being exposed to blood or fluids that contain the virus, either through an open cut or during a transfusion. If a virus enters your body, your body's defense system (immune system) will try to fight the virus. You may be at higher risk for a viral illness if your immune system is weak. What are the signs or symptoms? Symptoms vary depending on the type of virus and the location of the cells that it invades. Common symptoms of the main types of viral illnesses  include: Cold and flu viruses   Fever.  Headache.  Sore throat.  Muscle aches.  Nasal congestion.  Cough. Digestive system (gastrointestinal) viruses   Fever.  Abdominal pain.  Nausea.  Diarrhea. Liver viruses (hepatitis)   Loss of appetite.  Tiredness.  Yellowing of the skin (jaundice). Brain and spinal cord viruses   Fever.  Headache.  Stiff neck.  Nausea and vomiting.  Confusion or sleepiness. Skin viruses   Warts.  Itching.  Rash. Sexually transmitted viruses   Discharge.  Swelling.  Redness.  Rash. How is this treated? Viruses can be difficult to treat because they live within cells. Antibiotic medicines do not treat viruses because these drugs do not get inside cells. Treatment for a viral illness may include:  Resting and drinking plenty of fluids.  Medicines to relieve symptoms. These can include over-the-counter medicine for pain and fever, medicines for cough or congestion, and medicines to relieve diarrhea.  Antiviral medicines. These drugs are available only for certain types of viruses. They may help reduce flu symptoms if taken early. There are also many antiviral medicines for hepatitis and HIV/AIDS. Some viral illnesses can be prevented with vaccinations. A common example is the flu shot. Follow these instructions at home: Medicines    Take over-the-counter and prescription medicines only as told by your health care provider.  If you were prescribed an antiviral medicine, take it as told by your health care provider. Do not stop taking the medicine even if you start to   feel better.  Be aware of when antibiotics are needed and when they are not needed. Antibiotics do not treat viruses. If your health care provider thinks that you may have a bacterial infection as well as a viral infection, you may get an antibiotic.  Do not ask for an antibiotic prescription if you have been diagnosed with a viral illness. That will not make  your illness go away faster.  Frequently taking antibiotics when they are not needed can lead to antibiotic resistance. When this develops, the medicine no longer works against the bacteria that it normally fights. General instructions   Drink enough fluids to keep your urine clear or pale yellow.  Rest as much as possible.  Return to your normal activities as told by your health care provider. Ask your health care provider what activities are safe for you.  Keep all follow-up visits as told by your health care provider. This is important. How is this prevented? Take these actions to reduce your risk of viral infection:  Eat a healthy diet and get enough rest.  Wash your hands often with soap and water. This is especially important when you are in public places. If soap and water are not available, use hand sanitizer.  Avoid close contact with friends and family who have a viral illness.  If you travel to areas where viral gastrointestinal infection is common, avoid drinking water or eating raw food.  Keep your immunizations up to date. Get a flu shot every year as told by your health care provider.  Do not share toothbrushes, nail clippers, razors, or needles with other people.  Always practice safe sex. Contact a health care provider if:  You have symptoms of a viral illness that do not go away.  Your symptoms come back after going away.  Your symptoms get worse. Get help right away if:  You have trouble breathing.  You have a severe headache or a stiff neck.  You have severe vomiting or abdominal pain. This information is not intended to replace advice given to you by your health care provider. Make sure you discuss any questions you have with your health care provider. Document Released: 06/19/2015 Document Revised: 07/22/2015 Document Reviewed: 06/19/2015 Elsevier Interactive Patient Education  2017 Else[employer] Inc.  

## 2016-02-24 NOTE — Progress Notes (Signed)
   Subjective:    Patient ID: Alexis Small, female    DOB: 04-30-1940, 76 y.o.   MRN: [id number]  HPI Patient comes in today complaining of 5 days of cough and nasal congestion. She's had a lot of facial pressure and sore throat. She is blowing out yellow nasal discharge. She denies any fever. She has been taking Tylenol nighttime cold medication without any significant relief. She has been taking care of her grandchild who was recently diagnosed with RSV.  + laryngitis. No GI sxs.  No fever.  Has been using honey for her ST.     Review of Systems       Objective:   Physical Exam  Constitutional: She is oriented to person, place, and time. She appears well-developed and well-nourished.  HENT:  Head: Normocephalic and atraumatic.  Right Ear: External ear normal.  Left Ear: External ear normal.  Nose: Nose normal.  Mouth/Throat: Oropharynx is clear and moist.  TMs and canals are clear.   Eyes: Conjunctivae and EOM are normal. Pupils are equal, round, and reactive to light.  Neck: Neck supple. No thyromegaly present.  Cardiovascular: Normal rate, regular rhythm and normal heart sounds.   Pulmonary/Chest: Effort normal and breath sounds normal. She has no wheezes.  Lymphadenopathy:    She has no cervical adenopathy.  Neurological: She is alert and oriented to person, place, and time.  Skin: Skin is warm and dry.  Psychiatric: She has a normal mood and affect.       Assessment & Plan:  Viral resp illness - likely viral. Gave reassurance. Call if getting worse or if not better by Monday.  Increase fluids.

## 2016-02-29 ENCOUNTER — Ambulatory Visit (INDEPENDENT_AMBULATORY_CARE_PROVIDER_SITE_OTHER): Payer: Medicare Other | Admitting: Family Medicine

## 2016-02-29 ENCOUNTER — Encounter: Payer: Self-pay | Admitting: Family Medicine

## 2016-02-29 VITALS — BP 137/68 | HR 90 | Temp 98.2°F | Ht 61.0 in | Wt 142.0 lb

## 2016-02-29 DIAGNOSIS — J019 Acute sinusitis, unspecified: Secondary | ICD-10-CM | POA: Diagnosis not present

## 2016-02-29 NOTE — Progress Notes (Signed)
   Subjective:    Patient ID: Alexis Small, female    DOB: April 23, 1940, 76 y.o.   MRN: [id number]  HPI Follow-up viral respiratory illness. She's been sick for 10 days now. She was accessed in the office about 5 days ago with cough and nasal congestion. She had been exposed to RSV. Her grandchild and had all 6 parents and laryngitis. No fever. Still couging with some brown sputum.  Still some yellow nasal congestion with blood but is feeling some better. Some de appetite.     Review of Systems     Objective:   Physical Exam  Constitutional: She is oriented to person, place, and time. She appears well-developed and well-nourished.  HENT:  Head: Normocephalic and atraumatic.  Right Ear: External ear normal.  Left Ear: External ear normal.  Nose: Nose normal.  Mouth/Throat: Oropharynx is clear and moist.  TMs and canals are clear.   Eyes: Conjunctivae and EOM are normal. Pupils are equal, round, and reactive to light.  Neck: Neck supple. No thyromegaly present.  Cardiovascular: Normal rate, regular rhythm and normal heart sounds.   Pulmonary/Chest: Effort normal and breath sounds normal. She has no wheezes.  Lymphadenopathy:    She has no cervical adenopathy.  Neurological: She is alert and oriented to person, place, and time.  Skin: Skin is warm and dry.  Psychiatric: She has a normal mood and affect.       Assessment & Plan:  Acute sinusitis-discussed at this point she has been sick for 10 days we could argue to put her on antibiotic but she does feel like she is actually getting a little better so we opted to just give it a few more days to see if she continues to improve. She can call back at any point if she's not improving or if she suddenly feels worse and we will call in an antibiotic for acute sinusitis.

## 2016-03-02 ENCOUNTER — Encounter: Payer: Self-pay | Admitting: *Deleted

## 2016-03-07 ENCOUNTER — Ambulatory Visit: Payer: Medicare Other | Admitting: Family Medicine

## 2016-03-08 DIAGNOSIS — L309 Dermatitis, unspecified: Secondary | ICD-10-CM | POA: Diagnosis not present

## 2016-03-08 DIAGNOSIS — L814 Other melanin hyperpigmentation: Secondary | ICD-10-CM | POA: Diagnosis not present

## 2016-03-08 DIAGNOSIS — L219 Seborrheic dermatitis, unspecified: Secondary | ICD-10-CM | POA: Diagnosis not present

## 2016-03-24 DIAGNOSIS — Z780 Asymptomatic menopausal state: Secondary | ICD-10-CM | POA: Diagnosis not present

## 2016-03-24 DIAGNOSIS — R1110 Vomiting, unspecified: Secondary | ICD-10-CM | POA: Diagnosis not present

## 2016-03-24 DIAGNOSIS — R197 Diarrhea, unspecified: Secondary | ICD-10-CM | POA: Diagnosis not present

## 2016-03-24 DIAGNOSIS — Z79899 Other long term (current) drug therapy: Secondary | ICD-10-CM | POA: Diagnosis not present

## 2016-03-24 DIAGNOSIS — R109 Unspecified abdominal pain: Secondary | ICD-10-CM | POA: Diagnosis not present

## 2016-03-24 DIAGNOSIS — K5730 Diverticulosis of large intestine without perforation or abscess without bleeding: Secondary | ICD-10-CM | POA: Diagnosis not present

## 2016-03-24 DIAGNOSIS — E860 Dehydration: Secondary | ICD-10-CM | POA: Diagnosis not present

## 2016-03-24 DIAGNOSIS — E876 Hypokalemia: Secondary | ICD-10-CM | POA: Diagnosis not present

## 2016-03-24 DIAGNOSIS — K2970 Gastritis, unspecified, without bleeding: Secondary | ICD-10-CM | POA: Diagnosis not present

## 2016-03-24 DIAGNOSIS — R55 Syncope and collapse: Secondary | ICD-10-CM | POA: Diagnosis not present

## 2016-03-24 DIAGNOSIS — K529 Noninfective gastroenteritis and colitis, unspecified: Secondary | ICD-10-CM | POA: Diagnosis not present

## 2016-03-24 DIAGNOSIS — R112 Nausea with vomiting, unspecified: Secondary | ICD-10-CM | POA: Diagnosis not present

## 2016-03-25 DIAGNOSIS — R1110 Vomiting, unspecified: Secondary | ICD-10-CM | POA: Diagnosis not present

## 2016-03-25 DIAGNOSIS — D72829 Elevated white blood cell count, unspecified: Secondary | ICD-10-CM | POA: Diagnosis not present

## 2016-03-25 DIAGNOSIS — K529 Noninfective gastroenteritis and colitis, unspecified: Secondary | ICD-10-CM | POA: Diagnosis not present

## 2016-03-25 DIAGNOSIS — E860 Dehydration: Secondary | ICD-10-CM | POA: Diagnosis not present

## 2016-03-25 DIAGNOSIS — R55 Syncope and collapse: Secondary | ICD-10-CM | POA: Diagnosis not present

## 2016-03-28 DIAGNOSIS — Z1231 Encounter for screening mammogram for malignant neoplasm of breast: Secondary | ICD-10-CM | POA: Diagnosis not present

## 2016-03-28 LAB — HM MAMMOGRAPHY

## 2016-04-04 ENCOUNTER — Inpatient Hospital Stay: Payer: Medicare Other | Admitting: Family Medicine

## 2016-04-04 DIAGNOSIS — M25511 Pain in right shoulder: Secondary | ICD-10-CM | POA: Diagnosis not present

## 2016-04-05 ENCOUNTER — Ambulatory Visit (INDEPENDENT_AMBULATORY_CARE_PROVIDER_SITE_OTHER): Payer: Medicare Other | Admitting: Family Medicine

## 2016-04-05 ENCOUNTER — Encounter: Payer: Self-pay | Admitting: Family Medicine

## 2016-04-05 VITALS — BP 128/76 | HR 71 | Ht 61.0 in | Wt 137.8 lb

## 2016-04-05 DIAGNOSIS — E860 Dehydration: Secondary | ICD-10-CM | POA: Diagnosis not present

## 2016-04-05 DIAGNOSIS — K529 Noninfective gastroenteritis and colitis, unspecified: Secondary | ICD-10-CM | POA: Diagnosis not present

## 2016-04-05 NOTE — Progress Notes (Signed)
Subjective:    CC: Hospital follow-up  HPI:  76 year old female comes in today to follow-up for recent hospital visit to [facility] on February 1 for syncope vomiting and dehydration. She had a negative cardiac workup. Was felt that she had enterocolitis versus food poisoning. Patient had fish tacos prior to illness. She did have carotid Dopplers done which showed minimal internal carotid stenosis no significant plaque. A cardiogram was essentially normal and CT of the abdomen showed enterocolitis.    Past medical history, Surgical history, Family history not pertinant except as noted below, Social history, Allergies, and medications have been entered into the medical record, reviewed, and corrections made.   Review of Systems: No fevers, chills, night sweats, weight loss, chest pain, or shortness of breath.   Objective:    General: Well Developed, well nourished, and in no acute distress.  Neuro: Alert and oriented x3, extra-ocular muscles intact, sensation grossly intact.  HEENT: Normocephalic, atraumatic  Skin: Warm and dry, no rashes. Cardiac: Regular rate and rhythm, no murmurs rubs or gallops, no lower extremity edema.  Respiratory: Clear to auscultation bilaterally. Not using accessory muscles, speaking in full sentences. Abd: soft, notender, normal BS.     Impression and Recommendations:   Enterocolitis - doing much better. Eating normally. No abdominal discomfort or pain today normal exam. Will recheck BMP since she did receive fluids since she had low potassium. Will call with results once available.  Encourage diet as tolerated.

## 2016-04-06 LAB — BASIC METABOLIC PANEL WITH GFR
BUN: 14 mg/dL (ref 7–25)
CALCIUM: 9.1 mg/dL (ref 8.6–10.4)
CO2: 26 mmol/L (ref 20–31)
CREATININE: 0.75 mg/dL (ref 0.60–0.93)
Chloride: 103 mmol/L (ref 98–110)
GFR, Est Non African American: 78 mL/min (ref >=60)
GLUCOSE: 84 mg/dL (ref 65–99)
POTASSIUM: 4.2 mmol/L (ref 3.5–5.3)
Sodium: 139 mmol/L (ref 135–146)

## 2016-04-06 NOTE — Progress Notes (Signed)
All labs are normal. 

## 2016-04-08 ENCOUNTER — Encounter: Payer: Self-pay | Admitting: Family Medicine

## 2016-06-30 DIAGNOSIS — M25511 Pain in right shoulder: Secondary | ICD-10-CM | POA: Diagnosis not present

## 2016-06-30 DIAGNOSIS — M6281 Muscle weakness (generalized): Secondary | ICD-10-CM | POA: Diagnosis not present

## 2016-07-05 DIAGNOSIS — M6281 Muscle weakness (generalized): Secondary | ICD-10-CM | POA: Diagnosis not present

## 2016-07-05 DIAGNOSIS — M25511 Pain in right shoulder: Secondary | ICD-10-CM | POA: Diagnosis not present

## 2016-07-13 DIAGNOSIS — M6281 Muscle weakness (generalized): Secondary | ICD-10-CM | POA: Diagnosis not present

## 2016-07-13 DIAGNOSIS — M25511 Pain in right shoulder: Secondary | ICD-10-CM | POA: Diagnosis not present

## 2016-07-20 DIAGNOSIS — M25511 Pain in right shoulder: Secondary | ICD-10-CM | POA: Diagnosis not present

## 2016-07-20 DIAGNOSIS — M546 Pain in thoracic spine: Secondary | ICD-10-CM | POA: Diagnosis not present

## 2016-07-20 DIAGNOSIS — M542 Cervicalgia: Secondary | ICD-10-CM | POA: Diagnosis not present

## 2016-07-20 DIAGNOSIS — M6281 Muscle weakness (generalized): Secondary | ICD-10-CM | POA: Diagnosis not present

## 2016-07-21 DIAGNOSIS — H2513 Age-related nuclear cataract, bilateral: Secondary | ICD-10-CM | POA: Diagnosis not present

## 2016-07-21 DIAGNOSIS — H1851 Endothelial corneal dystrophy: Secondary | ICD-10-CM | POA: Diagnosis not present

## 2016-07-21 DIAGNOSIS — H40013 Open angle with borderline findings, low risk, bilateral: Secondary | ICD-10-CM | POA: Diagnosis not present

## 2016-07-21 DIAGNOSIS — Z947 Corneal transplant status: Secondary | ICD-10-CM | POA: Diagnosis not present

## 2016-07-26 DIAGNOSIS — L570 Actinic keratosis: Secondary | ICD-10-CM | POA: Diagnosis not present

## 2016-07-26 DIAGNOSIS — D225 Melanocytic nevi of trunk: Secondary | ICD-10-CM | POA: Diagnosis not present

## 2016-07-26 DIAGNOSIS — Z85828 Personal history of other malignant neoplasm of skin: Secondary | ICD-10-CM | POA: Diagnosis not present

## 2016-07-27 DIAGNOSIS — M25511 Pain in right shoulder: Secondary | ICD-10-CM | POA: Diagnosis not present

## 2016-07-27 DIAGNOSIS — M542 Cervicalgia: Secondary | ICD-10-CM | POA: Diagnosis not present

## 2016-07-27 DIAGNOSIS — M546 Pain in thoracic spine: Secondary | ICD-10-CM | POA: Diagnosis not present

## 2016-08-25 ENCOUNTER — Encounter: Payer: Self-pay | Admitting: Sports Medicine

## 2016-08-25 ENCOUNTER — Ambulatory Visit (INDEPENDENT_AMBULATORY_CARE_PROVIDER_SITE_OTHER): Payer: Medicare Other | Admitting: Sports Medicine

## 2016-08-25 ENCOUNTER — Ambulatory Visit (INDEPENDENT_AMBULATORY_CARE_PROVIDER_SITE_OTHER): Payer: Medicare Other

## 2016-08-25 DIAGNOSIS — S99812A Other specified injuries of left ankle, initial encounter: Secondary | ICD-10-CM

## 2016-08-25 DIAGNOSIS — M79672 Pain in left foot: Secondary | ICD-10-CM | POA: Diagnosis not present

## 2016-08-25 DIAGNOSIS — S99912A Unspecified injury of left ankle, initial encounter: Secondary | ICD-10-CM | POA: Diagnosis not present

## 2016-08-25 DIAGNOSIS — M25562 Pain in left knee: Secondary | ICD-10-CM | POA: Diagnosis not present

## 2016-08-25 DIAGNOSIS — S99922A Unspecified injury of left foot, initial encounter: Secondary | ICD-10-CM | POA: Diagnosis not present

## 2016-08-25 DIAGNOSIS — X501XXA Overexertion from prolonged static or awkward postures, initial encounter: Secondary | ICD-10-CM | POA: Diagnosis not present

## 2016-08-25 NOTE — Progress Notes (Signed)
   Subjective:    I'm seeing this patient as a consultation for:  Dr. Beatrice Lecher  CC: Left foot pain  HPI: This is a pleasant 76 year old female with midfoot synovitis, she never came back for custom orthotics or injection. More recently she inverted her foot, she had immediate pain, but really no swelling or bruising. She placed herself into a boot. She is here for further evaluation.  Past medical history:  Negative.  See flowsheet/record as well for more information.  Surgical history: Negative.  See flowsheet/record as well for more information.  Family history: Negative.  See flowsheet/record as well for more information.  Social history: Negative.  See flowsheet/record as well for more information.  Allergies, and medications have been entered into the medical record, reviewed, and no changes needed.   Review of Systems: No headache, visual changes, nausea, vomiting, diarrhea, constipation, dizziness, abdominal pain, skin rash, fevers, chills, night sweats, weight loss, swollen lymph nodes, body aches, joint swelling, muscle aches, chest pain, shortness of breath, mood changes, visual or auditory hallucinations.   Objective:   General: Well Developed, well nourished, and in no acute distress.  Neuro/Psych: Alert and oriented x3, extra-ocular muscles intact, able to move all 4 extremities, sensation grossly intact. Skin: Warm and dry, no rashes noted.  Respiratory: Not using accessory muscles, speaking in full sentences, trachea midline.  Cardiovascular: Pulses palpable, no extremity edema. Abdomen: Does not appear distended. Left Ankle: No visible erythema or swelling. Range of motion is full in all directions. Strength is 5/5 in all directions. Stable lateral and medial ligaments; squeeze test and kleiger test unremarkable; Talar dome nontender; No pain at base of 5th MT; No tenderness over cuboid; No tenderness over N spot or navicular prominence No tenderness on  posterior aspects of lateral and medial malleolus No sign of peroneal tendon subluxations; Negative tarsal tunnel tinel's Able to walk 4 steps. Left Foot: Minimal dorsal midfoot swelling with mild tenderness over the anterior process of the calcaneus, as well as the dorsal talus. Range of motion is full in all directions. Strength is 5/5 in all directions. No hallux valgus. No pes cavus or pes planus. No abnormal callus noted. No pain over the navicular prominence, or base of fifth metatarsal. No tenderness to palpation of the calcaneal insertion of plantar fascia. No pain at the Achilles insertion. No pain over the calcaneal bursa. No pain of the retrocalcaneal bursa. No tenderness to palpation over the tarsals, metatarsals, or phalanges. No hallux rigidus or limitus. No tenderness palpation over interphalangeal joints. No pain with compression of the metatarsal heads. Neurovascularly intact distally.  Impression and Recommendations:   This case required medical decision making of moderate complexity.  Left foot pain Recent sprain. X-rays of the foot and ankle, she will wear her boot for 2 weeks, and take ibuprofen 800mg  3 times per day. She never came back for her custom orthotics. She does have degenerative synovitis of the midfoot. If persistent pain in 2 weeks we can do an injection.

## 2016-08-25 NOTE — Assessment & Plan Note (Signed)
Recent sprain. X-rays of the foot and ankle, she will wear her boot for 2 weeks, and take ibuprofen 800mg  3 times per day. She never came back for her custom orthotics. She does have degenerative synovitis of the midfoot. If persistent pain in 2 weeks we can do an injection.

## 2016-09-01 DIAGNOSIS — T162XXA Foreign body in left ear, initial encounter: Secondary | ICD-10-CM | POA: Diagnosis not present

## 2016-09-08 ENCOUNTER — Ambulatory Visit: Payer: Medicare Other | Admitting: Sports Medicine

## 2016-09-16 DIAGNOSIS — T1591XA Foreign body on external eye, part unspecified, right eye, initial encounter: Secondary | ICD-10-CM | POA: Diagnosis not present

## 2016-11-09 DIAGNOSIS — L309 Dermatitis, unspecified: Secondary | ICD-10-CM | POA: Diagnosis not present

## 2017-03-16 ENCOUNTER — Ambulatory Visit: Payer: Medicare Other | Admitting: Family Medicine

## 2017-05-09 ENCOUNTER — Telehealth: Payer: Self-pay

## 2017-05-09 MED ORDER — OSELTAMIVIR PHOSPHATE 75 MG PO CAPS: 75.0000 mg | ORAL_CAPSULE | Freq: Every day | ORAL | 0 refills | Status: DC

## 2017-05-09 NOTE — Telephone Encounter (Signed)
Alexis Small called and states her grandson, who lives with her, tested positive for the flu. She has not flu symptoms. She would like tamiflu sent to [facility].    CMP Latest Ref Rng & Units 04/05/2016 10/30/2015 02/05/2014  Glucose 65 - 99 mg/dL 84 98 89  BUN 7 - 25 mg/dL 14 16 18   Creatinine 0.60 - 0.93 mg/dL 0.75 0.80 0.74  Sodium 135 - 146 mmol/L 139 140 137  Potassium 3.5 - 5.3 mmol/L 4.2 4.8 4.5  Chloride 98 - 110 mmol/L 103 104 104  CO2 20 - 31 mmol/L 26 26 27   Calcium 8.6 - 10.4 mg/dL 9.1 9.5 9.6  Total Protein 6.1 - 8.1 g/dL - 7.5 7.1  Total Bilirubin 0.2 - 1.2 mg/dL - 0.7 0.7  Alkaline Phos 33 - 130 U/L - 71 75  AST 10 - 35 U/L - 16 15  ALT 6 - 29 U/L - 12 11

## 2017-05-09 NOTE — Telephone Encounter (Signed)
Rx sent 

## 2017-05-09 NOTE — Telephone Encounter (Signed)
Patient advised to call pharmacy.

## 2017-06-01 ENCOUNTER — Ambulatory Visit: Payer: Medicare Other | Admitting: Family Medicine

## 2017-06-12 ENCOUNTER — Encounter: Payer: Self-pay | Admitting: Family Medicine

## 2017-06-12 ENCOUNTER — Ambulatory Visit (INDEPENDENT_AMBULATORY_CARE_PROVIDER_SITE_OTHER): Payer: Medicare Other | Admitting: Family Medicine

## 2017-06-12 ENCOUNTER — Ambulatory Visit (INDEPENDENT_AMBULATORY_CARE_PROVIDER_SITE_OTHER): Payer: Medicare Other

## 2017-06-12 VITALS — BP 137/68 | HR 68 | Ht 61.0 in | Wt 136.0 lb

## 2017-06-12 DIAGNOSIS — M79604 Pain in right leg: Secondary | ICD-10-CM

## 2017-06-12 DIAGNOSIS — M6281 Muscle weakness (generalized): Secondary | ICD-10-CM

## 2017-06-12 DIAGNOSIS — M5416 Radiculopathy, lumbar region: Secondary | ICD-10-CM | POA: Diagnosis not present

## 2017-06-12 DIAGNOSIS — I83893 Varicose veins of bilateral lower extremities with other complications: Secondary | ICD-10-CM | POA: Diagnosis not present

## 2017-06-12 DIAGNOSIS — M545 Low back pain: Secondary | ICD-10-CM | POA: Diagnosis not present

## 2017-06-12 LAB — CK: CK TOTAL: 40 U/L (ref 29–143)

## 2017-06-12 LAB — COMPLETE METABOLIC PANEL WITH GFR
AG RATIO: 1.4 (calc) (ref 1.0–2.5)
ALKALINE PHOSPHATASE (APISO): 69 U/L (ref 33–130)
ALT: 10 U/L (ref 6–29)
AST: 15 U/L (ref 10–35)
Albumin: 4.2 g/dL (ref 3.6–5.1)
BILIRUBIN TOTAL: 1 mg/dL (ref 0.2–1.2)
BUN: 15 mg/dL (ref 7–25)
CHLORIDE: 104 mmol/L (ref 98–110)
CO2: 29 mmol/L (ref 20–32)
Calcium: 9.1 mg/dL (ref 8.6–10.4)
Creat: 0.79 mg/dL (ref 0.60–0.93)
GFR, EST AFRICAN AMERICAN: 84 mL/min/{1.73_m2} (ref >=60)
GFR, Est Non African American: 73 mL/min/{1.73_m2} (ref >=60)
Globulin: 2.9 g/dL (calc) (ref 1.9–3.7)
Glucose, Bld: 93 mg/dL (ref 65–99)
POTASSIUM: 4 mmol/L (ref 3.5–5.3)
Sodium: 139 mmol/L (ref 135–146)
TOTAL PROTEIN: 7.1 g/dL (ref 6.1–8.1)

## 2017-06-12 LAB — CBC WITH DIFFERENTIAL/PLATELET
BASOS ABS: 70 {cells}/uL (ref 0–200)
Basophils Relative: 1.3 %
EOS PCT: 2.8 %
Eosinophils Absolute: 151 cells/uL (ref 15–500)
HCT: 39 % (ref 35.0–45.0)
Hemoglobin: 13.3 g/dL (ref 11.7–15.5)
Lymphs Abs: 2030 cells/uL (ref 850–3900)
MCH: 29.4 pg (ref 27.0–33.0)
MCHC: 34.1 g/dL (ref 32.0–36.0)
MCV: 86.3 fL (ref 80.0–100.0)
MONOS PCT: 11 %
MPV: 11.6 fL (ref 7.5–12.5)
NEUTROS PCT: 47.3 %
Neutro Abs: 2554 cells/uL (ref 1500–7800)
PLATELETS: 200 10*3/uL (ref 140–400)
RBC: 4.52 10*6/uL (ref 3.80–5.10)
RDW: 13 % (ref 11.0–15.0)
Total Lymphocyte: 37.6 %
WBC mixed population: 594 cells/uL (ref 200–950)
WBC: 5.4 10*3/uL (ref 3.8–10.8)

## 2017-06-12 LAB — SEDIMENTATION RATE: Sed Rate: 9 mm/h (ref 0–30)

## 2017-06-12 LAB — TSH: TSH: 1.4 mIU/L (ref 0.40–4.50)

## 2017-06-12 NOTE — Patient Instructions (Signed)
Recommend sleep on your back with a pillow under your knees to keep the spine supported.

## 2017-06-12 NOTE — Progress Notes (Signed)
Subjective:    Patient ID: Alexis Small, female    DOB: 03-12-1940, 77 y.o.   MRN: [id number]  HPI: 76 yo female comes in today c/o of right leg pain on and off x 1 year. Says it aches. Worse with long car rides.  Says bothers her at night as well, particularly when she lies on her side.  She notices if she lays on his back it is not nearly as bad..   In fact most often it bothers her at night and not nearly as much during the day.  During the daytime she takes care of her grandson who is 21 months.  Denies any recent trauma or injury.  She says she feels like it is the entire leg not just the outside or the inside and it was a week.  She really does not take medication for it.  She denies any back pain or recent injury but she does lift her grandson quite often.  She also just feels like overall her muscles are more weak than they should be in both legs and both arms.  She is having a hard time getting up and down off the floor even though she does not regularly she feels like she should be getting stronger and she is not.  No swelling of the leg.  She also c/o of varicose veins and would like a referral.  He says the not particularly painful or itchy.  She does not wear compression stockings.  Review of Systems  BP 137/68   Pulse 68   Ht 5\' 1"  (1.549 m)   Wt 136 lb (61.7 kg)   SpO2 100%   BMI 25.70 kg/m     Allergies  Allergen Reactions  . Oxycodone-Acetaminophen     Other reaction(s): Other (See Comments) "[place]"  . Prednisone Other (See Comments)    Sweats, chest pain, etc  . Sulfamethoxazole     Other reaction(s): Other (See Comments) unknown    Past Medical History:  Diagnosis Date  . Shingles     Past Surgical History:  Procedure Laterality Date  . ABDOMINAL HYSTERECTOMY  1970's   Complete, 2x  . APPENDECTOMY  1970's  . BREAST SURGERY  1980's   reduction  . CORNEAL TRANSPLANT  11/2014   [facility]    Social History   Socioeconomic  History  . Marital status: Widowed    Spouse name: Not on file  . Number of children: 3  . Years of education: Not on file  . Highest education level: Not on file  Occupational History    Employer: [employer]  Social Needs  . Financial resource strain: Not on file  . Food insecurity:    Worry: Not on file    Inability: Not on file  . Transportation needs:    Medical: Not on file    Non-medical: Not on file  Tobacco Use  . Smoking status: Never Smoker  . Smokeless tobacco: Never Used  Substance and Sexual Activity  . Alcohol use: No  . Drug use: No  . 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  Lifestyle  . Physical activity:    Days per week: Not on file    Minutes per session: Not on file  . Stress: Not on file  Relationships  . Social connections:    Talks on phone: Not on file    Gets together: Not on  file    Attends religious service: Not on file    Active member of club or organization: Not on file    Attends meetings of clubs or organizations: Not on file    Relationship status: Not on file  . Intimate partner violence:    Fear of current or ex partner: Not on file    Emotionally abused: Not on file    Physically abused: Not on file    Forced sexual activity: Not on file  Other Topics Concern  . Not on file  Social History Narrative   Walking for exercise.  No daily caffeine.     Family History  Problem Relation Age of Onset  . Aneurysm Mother 26       cerebral  . Coronary artery disease Sister 48       deceased age 60  . COPD Sister        smoker  . Lung cancer Sister   . Birth defects Neg Hx     Outpatient Encounter Medications as of 06/12/2017  Medication Sig  . AMBULATORY NON FORMULARY MEDICATION Take 2 tablets by mouth at bedtime. Medication Name: [facility] STRENGTH  . Omega 3-6-9 Fatty Acids (OMEGA 3-6-9 COMPLEX PO) Take by mouth.  . [DISCONTINUED] AMBULATORY NON  FORMULARY MEDICATION Take by mouth.  . [DISCONTINUED] oseltamivir (TAMIFLU) 75 MG capsule Take 1 capsule (75 mg total) by mouth daily.  . [DISCONTINUED] sodium chloride (MURO 128) 5 % ophthalmic solution 1 drop as needed. Reported on 03/06/2015   No facility-administered encounter medications on file as of 06/12/2017.          Objective:   Physical Exam  Constitutional: She is oriented to person, place, and time. She appears well-developed and well-nourished.  HENT:  Head: Normocephalic and atraumatic.  Cardiovascular: Normal rate, regular rhythm and normal heart sounds.  Pulmonary/Chest: Effort normal and breath sounds normal.  Musculoskeletal:  Tender over the right SI joint. nontender over the lumbar spine. Negative straight leg raise.  Hip, knee, ankle strength is 5 out of 5 bilaterally.  Patellar reflexes 1+ bilaterally.  She does have some large spider veins and varicose veins on both lower extremities.  Neurological: She is alert and oriented to person, place, and time.  Skin: Skin is warm and dry.  She does have some larger varicose  Veins and spider veins on her legs.    Psychiatric: She has a normal mood and affect. Her behavior is normal.        Assessment & Plan:  Right leg pain -suspect this is probably coming from her back.  She was unable to delineate a specific distribution for her pain and really truly felt like it was the entire leg that was aching.  It seems to be worse at night when she lays down on her side and feels better when she is lying flat on her back and during the daytime when she is up and moving around.  I think it could be coming from her spine lumbar spine and is somewhat positional.  We will start with lumbar x-ray and will call with results once available.  She also does some lifting of her grandson which certainly could be exacerbating things.  Muscle weakness -she also just feels like she is weaker than she really should be in general.  So we will  check muscle enzymes and for inflammation.  We did discuss though that as you get older it takes more repetition and training to keep the muscles  strong and would recommend really working on hamstring and quad strength.  Varicose veins-we will refer to vein and vascular specialist.

## 2017-06-19 ENCOUNTER — Telehealth: Payer: Self-pay

## 2017-06-20 ENCOUNTER — Encounter: Payer: Medicare Other | Admitting: Vascular Surgery

## 2017-07-07 NOTE — Telephone Encounter (Signed)
No additional note encounter 

## 2017-07-21 DIAGNOSIS — Z947 Corneal transplant status: Secondary | ICD-10-CM | POA: Diagnosis not present

## 2017-07-21 DIAGNOSIS — H1851 Endothelial corneal dystrophy: Secondary | ICD-10-CM | POA: Diagnosis not present

## 2017-07-21 DIAGNOSIS — H18451 Nodular corneal degeneration, right eye: Secondary | ICD-10-CM | POA: Diagnosis not present

## 2017-10-03 ENCOUNTER — Telehealth: Payer: Self-pay

## 2017-10-03 DIAGNOSIS — M79672 Pain in left foot: Secondary | ICD-10-CM

## 2017-10-03 DIAGNOSIS — M6281 Muscle weakness (generalized): Secondary | ICD-10-CM

## 2017-10-03 DIAGNOSIS — M79604 Pain in right leg: Secondary | ICD-10-CM

## 2017-10-03 NOTE — Telephone Encounter (Signed)
Telly would like to go to [facility] in [place] for her leg pain. Please advise.

## 2017-10-03 NOTE — Telephone Encounter (Signed)
Ok to order PT refer for right leg pain and weakness.

## 2017-10-04 NOTE — Telephone Encounter (Signed)
Pt advised.

## 2017-10-04 NOTE — Telephone Encounter (Signed)
Order placed

## 2017-10-12 ENCOUNTER — Encounter: Payer: Self-pay | Admitting: Rehabilitative and Restorative Service Providers"

## 2017-10-12 ENCOUNTER — Encounter (INDEPENDENT_AMBULATORY_CARE_PROVIDER_SITE_OTHER): Payer: Self-pay

## 2017-10-12 ENCOUNTER — Ambulatory Visit: Payer: Medicare Other | Admitting: Rehabilitative and Restorative Service Providers"

## 2017-10-12 DIAGNOSIS — R29898 Other symptoms and signs involving the musculoskeletal system: Secondary | ICD-10-CM | POA: Diagnosis not present

## 2017-10-12 DIAGNOSIS — M79604 Pain in right leg: Secondary | ICD-10-CM

## 2017-10-12 DIAGNOSIS — M25551 Pain in right hip: Secondary | ICD-10-CM

## 2017-10-12 NOTE — Patient Instructions (Signed)
  Piriformis Stretch   Lying on back, pull right knee toward opposite shoulder. Hold 30 seconds. Repeat 3 times. Do 2-3 sessions per day.  Lying on back with hips and knees bent feet spread apart  Drop one knee in and then the other   Self massage using a 4 inch plastic ball   TENS UNIT: This is helpful for muscle pain and spasm.   Search and Purchase a TENS 7000 2nd edition at www.tenspros.com. It should be less than $30.     TENS unit instructions: Do not shower or bathe with the unit on Turn the unit off before removing electrodes or batteries If the electrodes lose stickiness add a drop of water to the electrodes after they are disconnected from the unit and place on plastic sheet. If you continued to have difficulty, call the TENS unit company to purchase more electrodes. Do not apply lotion on the skin area prior to use. Make sure the skin is clean and dry as this will help prolong the life of the electrodes. After use, always check skin for unusual red areas, rash or other skin difficulties. If there are any skin problems, does not apply electrodes to the same area. Never remove the electrodes from the unit by pulling the wires. Do not use the TENS unit or electrodes other than as directed. Do not change electrode placement without consultating your therapist or physician. Keep 2 fingers with between each electrode.

## 2017-10-12 NOTE — Therapy (Signed)
[place], [place], [address] Phone: [phone]   Fax:  [phone]  Physical Therapy Evaluation  Patient Details  Name: Alexis Small MRN: [id number] Date of Birth: [birth year] Referring Provider: Dr Madilyn Fireman   Encounter Date: 10/12/2017  PT End of Session - 10/12/17 1719    Visit Number  1    Number of Visits  12    Date for PT Re-Evaluation  11/24/17    PT Start Time  7341    PT Stop Time  1633    PT Time Calculation (min)  63 min    Activity Tolerance  Patient tolerated treatment well       Past Medical History:  Diagnosis Date  . Shingles     Past Surgical History:  Procedure Laterality Date  . ABDOMINAL HYSTERECTOMY  1970's   Complete, 2x  . APPENDECTOMY  1970's  . BREAST SURGERY  1980's   reduction  . CORNEAL TRANSPLANT  11/2014   [facility]    There were no vitals filed for this visit.   Subjective Assessment - 10/12/17 1541    Subjective  Patient reports pain in the Rt hip over the past 2-3 yrs ago with persistent ache in the hip, increase in symptoms with increased activities. She has difficulty sleeping at night.     Diagnostic tests  xrays     Patient Stated Goals  get rid of the hip pain     Currently in Pain?  Yes    Pain Score  0-No pain    Pain Location  Hip    Pain Orientation  Right    Pain Descriptors / Indicators  Aching   "deep ache"    Pain Type  Chronic pain    Pain Radiating Towards  hip into the Rt thigh, calf and foot     Pain Onset  More than a month ago    Pain Frequency  Intermittent    Aggravating Factors   lying on her side or stomach     Pain Relieving Factors  nothing          [facility] PT Assessment - 10/12/17 0001      Assessment   Medical Diagnosis  Rt hip/LE pain     Referring Provider  Dr Madilyn Fireman    Onset Date/Surgical Date  08/21/16    Hand Dominance  Right    Next MD Visit  PRN     Prior Therapy  none       Precautions   Precautions  None      Balance Screen   Has the patient fallen in the past 6 months  No    Has the patient had a decrease in activity level because of a fear of falling?   No    Is the patient reluctant to leave their home because of a fear of falling?   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     Leisure  household chores; cooking; yoga intermittently       Observation/Other Assessments   Focus on Therapeutic Outcomes (FOTO)   43% limitation       Sensation   Additional Comments  WFL's per pt report       Posture/Postural Control   Posture Comments  head forward; shoulders rounded  AROM   Lumbar Flexion  100%    Lumbar Extension  60%    Lumbar - Right Side Bend  75%    Lumbar - Left Side Bend  75%    Lumbar - Right Rotation  60%    Lumbar - Left Rotation  60%      Strength   Overall Strength Comments  5/5 bilat LE's       Flexibility   Hamstrings  mild tightness     Quadriceps  tight bilat     ITB  mild tightness    Piriformis  tight Rt > Lt      Palpation   Spinal mobility  hypomobility with tenderness wit hCPA mobs L4/5/S1    Palpation comment  significant muscular tightness Rt piriformis; glut med      Transfers   Comments  poor body mechanics with transfers/transitional movements                Objective measurements completed on examination: See above findings.      [facility] Adult PT Treatment/Exercise - 10/12/17 0001      Therapeutic Activites    Therapeutic Activities  --   myofacial ball release work standing Rt piriformis      Knee/Hip Exercises: Stretches   Piriformis Stretch  Right;3 reps;30 seconds    Piriformis Stretch Limitations  supine travell       Knee/Hip Exercises: Supine   Other Supine Knee/Hip Exercises  hooklying alternate knee drop       Moist Heat Therapy   Number Minutes Moist Heat  20 Minutes     Moist Heat Location  Hip;Lumbar Spine   posterior hip Rt      Electrical Stimulation   Electrical Stimulation Location  Rt lumbar to Rt posterior hip/piriformis     Electrical Stimulation Action  IFC    Electrical Stimulation Parameters  to tolerance     Electrical Stimulation Goals  Pain;Tone             PT Education - 10/12/17 1607    Education Details  HEP TENS     Person(s) Educated  Patient    Methods  Explanation;Demonstration;Tactile cues;Verbal cues;Handout    Comprehension  Verbalized understanding;Returned demonstration;Verbal cues required;Tactile cues required          PT Long Term Goals - 10/12/17 1728      PT LONG TERM GOAL #1   Title  Decreased aching in Rt LE by 50-75% allowing patient to perform normal functional activities 11/24/17    Time  6    Period  Weeks    Status  New      PT LONG TERM GOAL #2   Title  Patient reports abiltiy to sleep 4-5 hours without awakening due to pain 11/24/17    Time  6    Period  Weeks    Status  New      PT LONG TERM GOAL #3   Title  Improve tissue extensibility with patient to demonstrate normal mobility Rt hip with piriformis stretch 11/24/17    Time  6    Period  Weeks      PT LONG TERM GOAL #4   Title  Independent in HEP 11/24/17    Time  6    Period  Weeks    Status  New      PT LONG TERM GOAL #5   Title  Improve FOTO to </= 39% limitation 11/24/17    Time  6  Period  Weeks    Status  New             Plan - 10/12/17 1720    Clinical Impression Statement  Patient presents with chronic Rt hip/LE pain which she describes as "deep ache". The Rt LE symptoms are intermittent in nature, present with increased activities and each night - creating great difficulty sleeping. Patient has limited hip mobility; muscular tightnessthrough the Rt posterior hip/piriforms; pain with functional activities including sleep. Patient will benefit from PT to address problems identified.      Clinical Presentation  Stable     Clinical Presentation due to:  3 year history of pain     Clinical Decision Making  Low    Rehab Potential  Good    PT Frequency  2x / week    PT Duration  6 weeks    PT Treatment/Interventions  Patient/family education;ADLs/Self Care Home Management;Cryotherapy;Electrical Stimulation;Iontophoresis 4mg /ml Dexamethasone;Moist Heat;Ultrasound;Dry needling;Manual techniques;Neuromuscular re-education;Therapeutic activities;Therapeutic exercise;Balance training    PT Next Visit Plan  review HEP; maual work through the Rt piriformis and posterior hip musculature; modalities as indicated     Consulted and Agree with Plan of Care  Patient       Patient will benefit from skilled therapeutic intervention in order to improve the following deficits and impairments:  Postural dysfunction, Improper body mechanics, Pain, Increased fascial restricitons, Increased muscle spasms, Decreased mobility, Decreased activity tolerance  Visit Diagnosis: Pain in right leg - Plan: PT plan of care cert/re-cert  Other symptoms and signs involving the musculoskeletal system - Plan: PT plan of care cert/re-cert     Problem List Patient Active Problem List   Diagnosis Date Noted  . Vitamin D deficiency 11/13/2015  . Left foot pain 09/10/2015  . Wears hearing aid 02/05/2014  . FUCH'S DYSTROPHY 09/19/2008  . HEADACHE, CHRONIC 09/19/2008  . CARCINOMA, SKIN, SQUAMOUS CELL 03/05/2008  . OSTEOPOROSIS 03/05/2008    Winner Valeriano Nilda Simmer  PT, MPH  10/12/2017, 5:38 PM  [facility] [place], [place], [address] Phone: [phone]   Fax:  [phone]  Name: Alexis Small MRN: [id number] Date of Birth: [birth year]

## 2017-10-16 ENCOUNTER — Ambulatory Visit: Payer: Medicare Other | Admitting: Physical Therapy

## 2017-10-16 DIAGNOSIS — M79604 Pain in right leg: Secondary | ICD-10-CM | POA: Diagnosis not present

## 2017-10-16 DIAGNOSIS — R29898 Other symptoms and signs involving the musculoskeletal system: Secondary | ICD-10-CM

## 2017-10-16 NOTE — Therapy (Signed)
[address] [place], [place], [address] Phone: [phone]   Fax:  [phone]  Physical Therapy Treatment  Patient Details  Name: Alexis Small MRN: [id number] Date of Birth: 03/31/1940 Referring Provider: Dr. Madilyn Fireman   Encounter Date: 10/16/2017  PT End of Session - 10/16/17 1148    Visit Number  2    Number of Visits  12    Date for PT Re-Evaluation  11/24/17    PT Start Time  2426    PT Stop Time  1248    PT Time Calculation (min)  59 min    Behavior During Therapy  [facility] for tasks assessed/performed       Past Medical History:  Diagnosis Date  . Shingles     Past Surgical History:  Procedure Laterality Date  . ABDOMINAL HYSTERECTOMY  1970's   Complete, 2x  . APPENDECTOMY  1970's  . BREAST SURGERY  1980's   reduction  . CORNEAL TRANSPLANT  11/2014   [facility]    There were no vitals filed for this visit.  Subjective Assessment - 10/16/17 1153    Subjective  Pt reports she slept last night, for the first time since the first of the year.  She's very excited about that.  She said the exercise is helping as well.   If she is very active with her 45 yr old great grandson, it still bothers her.     Patient Stated Goals  get rid of the hip pain     Currently in Pain?  Yes    Pain Score  1     Pain Location  Hip    Pain Orientation  Right    Pain Descriptors / Indicators  Aching   deep ache   Aggravating Factors   lying on her side    Pain Relieving Factors  stretch         [facility] PT Assessment - 10/16/17 0001      Assessment   Medical Diagnosis  Rt hip/LE pain     Referring Provider  Dr. Madilyn Fireman    Onset Date/Surgical Date  08/21/16    Hand Dominance  Right    Next MD Visit  PRN        [facility] Adult PT Treatment/Exercise - 10/16/17 0001      Bed Mobility   Bed Mobility  --      Transfers   Comments  reviewed log roll for supine to sit.       Lumbar Exercises:  Stretches   Development worker, international aid Limitations  --    ITB Stretch  Right;1 rep;30 seconds    ITB Stretch Limitations  pt reported increased ache and pain after performing    Figure 4 Stretch  1 rep;20 seconds   RLE   Other Lumbar Stretch Exercise  bilat hip IR/ ER stretch x 20 reps back and forth, 2 sets.       Lumbar Exercises: Aerobic   Nustep  L4: 5.5 min (slow pace) - legs only.    PTA present to discuss progress      Knee/Hip Exercises: Stretches   Passive Hamstring Stretch  Right;3 reps;30 seconds   supine with strap   Quad Stretch  Right;3 reps;30 seconds   prone with strap   Piriformis Stretch  Right;3 reps;30 seconds    Piriformis Stretch Limitations  supine travell, and then pulling knee towards shoulder, and fig 4  Modalities   Modalities  Electrical Stimulation;Moist Heat      Moist Heat Therapy   Number Minutes Moist Heat  15 Minutes    Moist Heat Location  Hip;Lumbar Spine   (Rt thigh and glute)     Electrical Stimulation   Electrical Stimulation Location  Rt piriformis/  Rt lateral quad and lateral hamstring    Electrical Stimulation Action  premod to each area    Electrical Stimulation Parameters  to tolerance    Electrical Stimulation Goals  Pain;Tone      Manual Therapy   Manual Therapy  Soft tissue mobilization    Soft tissue mobilization  STM to Rt glute (max/med), piriformis, lateral hamstring, ITB.  TPR for Rt piriformis.                    PT Long Term Goals - 10/16/17 1155      PT LONG TERM GOAL #1   Title  Decreased aching in Rt LE by 50-75% allowing patient to perform normal functional activities 11/24/17    Time  6    Period  Weeks    Status  On-going      PT LONG TERM GOAL #2   Title  Patient reports abiltiy to sleep 4-5 hours without awakening due to pain 11/24/17    Time  6    Period  Weeks    Status  On-going      PT LONG TERM GOAL #3   Title  Improve tissue extensibility with patient to demonstrate normal  mobility Rt hip with piriformis stretch 11/24/17    Time  6    Period  Weeks    Status  On-going      PT LONG TERM GOAL #4   Title  Independent in HEP 11/24/17    Time  6    Period  Weeks    Status  On-going      PT LONG TERM GOAL #5   Title  Improve FOTO to </= 39% limitation 11/24/17    Time  6    Period  Weeks    Status  On-going            Plan - 10/16/17 1241    Clinical Impression Statement  Pt had some increased pain in RLE with ITB stretch and Lt piriformis stretch.  She had palpable tightness in Rt lateral quad, lateral hamstring, distal ITB, piriformis, and glute med. Pt point tender and guarded with STM to these areas.  Pt would benefit from futher manual therapy to these areas.  Pt reported decrease in pain at end of session.   Pt requesting to decrease frequency to 1x/wk after next visit due to financial concerns.      PT Treatment/Interventions  Patient/family education;ADLs/Self Care Home Management;Cryotherapy;Electrical Stimulation;Iontophoresis 4mg /ml Dexamethasone;Moist Heat;Ultrasound;Dry needling;Manual techniques;Neuromuscular re-education;Therapeutic activities;Therapeutic exercise;Balance training    PT Next Visit Plan  progress HEP - add additional LE stretches.  manual therapy/ DN through RLE.     Consulted and Agree with Plan of Care  Patient       Patient will benefit from skilled therapeutic intervention in order to improve the following deficits and impairments:  Postural dysfunction, Improper body mechanics, Pain, Increased fascial restricitons, Increased muscle spasms, Decreased mobility, Decreased activity tolerance  Visit Diagnosis: Pain in right leg  Other symptoms and signs involving the musculoskeletal system     Problem List Patient Active Problem List   Diagnosis Date Noted  . Vitamin D deficiency 11/13/2015  .  Left foot pain 09/10/2015  . Wears hearing aid 02/05/2014  . FUCH'S DYSTROPHY 09/19/2008  . HEADACHE, CHRONIC 09/19/2008   . CARCINOMA, SKIN, SQUAMOUS CELL 03/05/2008  . OSTEOPOROSIS 03/05/2008   Kerin Perna, PTA 10/16/17 2:39 PM  [place], [place], [address] Phone: [phone]   Fax:  [phone]  Name: Alexis Small MRN: [id number] Date of Birth: [birth year]

## 2017-10-18 ENCOUNTER — Ambulatory Visit: Payer: Medicare Other | Admitting: Physical Therapy

## 2017-10-18 DIAGNOSIS — R29898 Other symptoms and signs involving the musculoskeletal system: Secondary | ICD-10-CM | POA: Diagnosis not present

## 2017-10-18 DIAGNOSIS — M79604 Pain in right leg: Secondary | ICD-10-CM

## 2017-10-18 NOTE — Patient Instructions (Signed)
Quads / HF, Prone KNEE: Quadriceps - Prone    Place strap around ankle. Bring ankle toward buttocks. Press hip into surface. Hold 30 seconds. Repeat 3 times per session. Do 2-3 sessions per day.  HIP: Hamstrings - Supine  Place strap around foot. Raise leg up, keeping knee straight.  Bend opposite knee to protect back if indicated. Hold 30 seconds. 3 reps per set, 2-3 sets per day  Piriformis Stretch   Lying on back, pull right knee toward opposite shoulder. Hold 30 seconds. Repeat 3 times. Do 2-3 sessions per day.  Clam    Lie on side, legs bent 90. Open top knee to ceiling, rotating leg outward. Touch toes to ankle of bottom leg. Close knees, rotating leg inward. Maintain hip position. Repeat __10__ times. Repeat on other side. Do __1__ sessions per day.  Therapeutic - Bridging    Lift buttocks, keeping back straight and arms on floor. Hold _a few___ seconds. Repeat __10__ times.   [facility] [place], [place] [address]  [phone] (office) [phone] (fax)

## 2017-10-18 NOTE — Therapy (Signed)
Alexis Small, [place], [address] Phone: [phone]   Fax:  [phone]  Physical Therapy Treatment  Patient Details  Name: Alexis Small MRN: [id number] Date of Birth: 07-09-1940 Referring Provider: Dr. Madilyn Fireman   Encounter Date: 10/18/2017  PT End of Session - 10/18/17 1520    Visit Number  3    Number of Visits  12    Date for PT Re-Evaluation  11/24/17    PT Start Time  8676    PT Stop Time  1616    PT Time Calculation (min)  60 min    Activity Tolerance  Patient tolerated treatment well    Behavior During Therapy  Alexis Small for tasks assessed/performed       Past Medical History:  Diagnosis Date  . Shingles     Past Surgical History:  Procedure Laterality Date  . ABDOMINAL HYSTERECTOMY  1970's   Complete, 2x  . APPENDECTOMY  1970's  . BREAST SURGERY  1980's   reduction  . CORNEAL TRANSPLANT  11/2014   Alexis Small    There were no vitals filed for this visit.  Subjective Assessment - 10/18/17 1521    Subjective  Pt reports she slept through the night Sunday and Monday night, but didn't sleep last night.  She said she was very busy yesterday, going up and down out of attic and taking care of her great grandson.  She has been doing her stretches, TENS, and self massage.     Patient Stated Goals  get rid of the hip pain     Currently in Pain?  No/denies    Pain Score  0-No pain       [facility] Adult PT Treatment/Exercise - 10/18/17 0001      Exercises   Exercises  Knee/Hip      Lumbar Exercises: Aerobic   Nustep  L4: 5.5 min (slow pace) - legs only.    PTA present to discuss progress      Knee/Hip Exercises: Stretches   Passive Hamstring Stretch  Right;3 reps;30 seconds   supine with strap   Quad Stretch  Right;Left;3 reps;30 seconds;Limitations   seated x 1, prone x 2.    Quad Stretch Limitations  LLE tighter than RLE    Piriformis Stretch  Right;3 reps;30 seconds    Travell      Knee/Hip Exercises: Standing   Other Standing Knee Exercises  1 lap around gym after manual therapy to decrease cramping.       Knee/Hip Exercises: Supine   Bridges  1 set;10 reps   3-5 sec hold     Knee/Hip Exercises: Sidelying   Clams  RLE:  x 10 reps, then 10 reps of reverse       Modalities   Modalities  Electrical Stimulation      Moist Heat Therapy   Number Minutes Moist Heat  20 Minutes    Moist Heat Location  Hip;Lumbar Spine   (Rt thigh and glute)     Electrical Stimulation   Electrical Stimulation Location  Rt piriformis/  Rt lateral quad and lateral hamstring    Electrical Stimulation Action  IFC    Electrical Stimulation Parameters  to tolerance    Electrical Stimulation Goals  Pain;Tone      Manual Therapy   Manual Therapy  Soft tissue mobilization    Soft tissue mobilization  STM to Rt glute (max/med), piriformis, lateral hamstring, ITB.  TPR for Rt piriformis.  PT Education - 10/18/17 1612    Education Details  HEP     Person(s) Educated  Patient    Methods  Explanation;Handout;Demonstration;Verbal cues    Comprehension  Verbalized understanding;Returned demonstration          PT Long Term Goals - 10/16/17 1155      PT LONG TERM GOAL #1   Title  Decreased aching in Rt LE by 50-75% allowing patient to perform normal functional activities 11/24/17    Time  6    Period  Weeks    Status  On-going      PT LONG TERM GOAL #2   Title  Patient reports abiltiy to sleep 4-5 hours without awakening due to pain 11/24/17    Time  6    Period  Weeks    Status  On-going      PT LONG TERM GOAL #3   Title  Improve tissue extensibility with patient to demonstrate normal mobility Rt hip with piriformis stretch 11/24/17    Time  6    Period  Weeks    Status  On-going      PT LONG TERM GOAL #4   Title  Independent in HEP 11/24/17    Time  6    Period  Weeks    Status  On-going      PT LONG TERM GOAL #5   Title  Improve FOTO  to </= 39% limitation 11/24/17    Time  6    Period  Weeks    Status  On-going            Plan - 10/18/17 1613    Clinical Impression Statement  Pt fatigued upon arrival, due to lack of sleep last night.  She has continued palpable tightness in Rt lateral/ posterior hip and thigh; point tender with STM.  She had some cramping with TPR to glute med and distal lateral Rt hamstring.  Pt reported decreased tightness and discomfort after MHP and estim.      Rehab Potential  Good    PT Frequency  2x / week    PT Duration  6 weeks    PT Treatment/Interventions  Patient/family education;ADLs/Self Care Home Management;Cryotherapy;Electrical Stimulation;Iontophoresis 4mg /ml Dexamethasone;Moist Heat;Ultrasound;Dry needling;Manual techniques;Neuromuscular re-education;Therapeutic activities;Therapeutic exercise;Balance training    PT Next Visit Plan  manual therapy/DN.  assess response to HEP.  assess pelvic alignment.     Consulted and Agree with Plan of Care  Patient       Patient will benefit from skilled therapeutic intervention in order to improve the following deficits and impairments:  Postural dysfunction, Improper body mechanics, Pain, Increased fascial restricitons, Increased muscle spasms, Decreased mobility, Decreased activity tolerance  Visit Diagnosis: Pain in right leg  Other symptoms and signs involving the musculoskeletal system     Problem List Patient Active Problem List   Diagnosis Date Noted  . Vitamin D deficiency 11/13/2015  . Left foot pain 09/10/2015  . Wears hearing aid 02/05/2014  . FUCH'S DYSTROPHY 09/19/2008  . HEADACHE, CHRONIC 09/19/2008  . CARCINOMA, SKIN, SQUAMOUS CELL 03/05/2008  . OSTEOPOROSIS 03/05/2008   Alexis Small, PTA 10/18/17 4:31 PM  [place], [place], [address] Phone: [phone]   Fax:  [phone]  Name: Alexis Small MRN:  [id number] Date of Birth: 1940-12-28

## 2017-10-26 ENCOUNTER — Encounter: Payer: Medicare Other | Admitting: Physical Therapy

## 2017-10-27 ENCOUNTER — Ambulatory Visit: Payer: Medicare Other | Admitting: Physical Therapy

## 2017-10-27 ENCOUNTER — Encounter: Payer: Self-pay | Admitting: Physical Therapy

## 2017-10-27 DIAGNOSIS — R29898 Other symptoms and signs involving the musculoskeletal system: Secondary | ICD-10-CM

## 2017-10-27 DIAGNOSIS — M79604 Pain in right leg: Secondary | ICD-10-CM

## 2017-10-27 NOTE — Patient Instructions (Signed)

## 2017-10-27 NOTE — Therapy (Signed)
[place] [address] [place], [place], [address] Phone: [phone]   Fax:  [phone]  Physical Therapy Treatment  Patient Details  Name: Alexis Small MRN: [id number] Date of Birth: [birth year] Referring Provider: Dr. Madilyn Fireman   Encounter Date: 10/27/2017  PT End of Session - 10/27/17 0934    Visit Number  4    Number of Visits  12    Date for PT Re-Evaluation  11/24/17    PT Start Time  0933    PT Stop Time  1018    PT Time Calculation (min)  45 min       Past Medical History:  Diagnosis Date  . Shingles     Past Surgical History:  Procedure Laterality Date  . ABDOMINAL HYSTERECTOMY  1970's   Complete, 2x  . APPENDECTOMY  1970's  . BREAST SURGERY  1980's   reduction  . CORNEAL TRANSPLANT  11/2014   [facility]    There were no vitals filed for this visit.  Subjective Assessment - 10/27/17 0934    Subjective  Pt reports she didn't sleep well last night, or nights prior.  She has been doing exercises, self massage, TENS every day.  She reports she used to only feel pain at night when trying to sleep, now she has radiating pain during day, especially if busy with grandson.    Currently in Pain?  No/denies    Pain Score  0-No pain        [facility] Adult PT Treatment/Exercise - 10/27/17 0001      Lumbar Exercises: Stretches   Other Lumbar Stretch Exercise  single knee to chest x 15 sec each side;  prone on elbows x 2 reps x 20 seconds.       Knee/Hip Exercises: Stretches   Passive Hamstring Stretch  Right;Left;3 reps;30 seconds   seated, with straight back.    Piriformis Stretch  Right;Left;3 reps;30 seconds      Knee/Hip Exercises: Sidelying   Clams  Rt/Lt clam and reverse clam x 10 each, on each leg.    some increased radicular symptoms after completion      Modalities   Modalities  Electrical Stimulation;Ultrasound;Iontophoresis      Acupuncturist  Location  Rt piriformis     Electrical Stimulation Action  combo [place]    Electrical Stimulation Parameters  to tolerance    Electrical Stimulation Goals  Tone;Pain      Ultrasound   Ultrasound Location  Rt piriformis    Ultrasound Parameters  combo [place]: 100%, 1.1 w/cm2, 1.0 mHz, 8 min     Ultrasound Goals  Pain;Other (Comment)   increased tone     Iontophoresis   Type of Iontophoresis  Dexamethasone    Location  origin of Rt piriformis    Dose  1.0 cc    Time  6 hr patch -80 mA stat patch      Manual Therapy   Soft tissue mobilization  STM to Rt glute (max/med), piriformis, lateral hamstring, ITB. (light pressure throughout, some mild guarding)             PT Education - 10/27/17 1244    Education Details  - instructed pt to stop performing clams in HEP.   info on ionto explained.     Person(s) Educated  Patient    Methods  Explanation    Comprehension  Verbalized understanding          PT  Long Term Goals - 10/16/17 1155      PT LONG TERM GOAL #1   Title  Decreased aching in Rt LE by 50-75% allowing patient to perform normal functional activities 11/24/17    Time  6    Period  Weeks    Status  On-going      PT LONG TERM GOAL #2   Title  Patient reports abiltiy to sleep 4-5 hours without awakening due to pain 11/24/17    Time  6    Period  Weeks    Status  On-going      PT LONG TERM GOAL #3   Title  Improve tissue extensibility with patient to demonstrate normal mobility Rt hip with piriformis stretch 11/24/17    Time  6    Period  Weeks    Status  On-going      PT LONG TERM GOAL #4   Title  Independent in HEP 11/24/17    Time  6    Period  Weeks    Status  On-going      PT LONG TERM GOAL #5   Title  Improve FOTO to </= 39% limitation 11/24/17    Time  6    Period  Weeks    Status  On-going            Plan - 10/27/17 1249    Clinical Impression Statement  Pt continues to have palpable tightness in Rt piriformis and Rt lateral hamstring.  Pt  reported increase in radiating symptoms in LE with clam ex as well as manual therapy to Rt piriformis.  This was significantly calmed down with use of combo [place].  Trial of ionto to Rt piriformis initiated today. Pt making very gradual progress towards goals.     Rehab Potential  Good    PT Frequency  2x / week    PT Duration  6 weeks    PT Treatment/Interventions  Patient/family education;ADLs/Self Care Home Management;Cryotherapy;Electrical Stimulation;Iontophoresis 4mg /ml Dexamethasone;Moist Heat;Ultrasound;Dry needling;Manual techniques;Neuromuscular re-education;Therapeutic activities;Therapeutic exercise;Balance training    PT Next Visit Plan  manual therapy/DN?Marland Kitchen  assess response to ionto.  assess pelvic alignment.     Consulted and Agree with Plan of Care  Patient       Patient will benefit from skilled therapeutic intervention in order to improve the following deficits and impairments:  Postural dysfunction, Improper body mechanics, Pain, Increased fascial restricitons, Increased muscle spasms, Decreased mobility, Decreased activity tolerance  Visit Diagnosis: Pain in right leg  Other symptoms and signs involving the musculoskeletal system     Problem List Patient Active Problem List   Diagnosis Date Noted  . Vitamin D deficiency 11/13/2015  . Left foot pain 09/10/2015  . Wears hearing aid 02/05/2014  . FUCH'S DYSTROPHY 09/19/2008  . HEADACHE, CHRONIC 09/19/2008  . CARCINOMA, SKIN, SQUAMOUS CELL 03/05/2008  . OSTEOPOROSIS 03/05/2008   Kerin Perna, PTA 10/27/17 12:51 PM  [facility] [place], [place], [address] Phone: [phone]   Fax:  [phone]  Name: Alexis Small MRN: [id number] Date of Birth: 12-03-1940

## 2017-11-02 ENCOUNTER — Ambulatory Visit (INDEPENDENT_AMBULATORY_CARE_PROVIDER_SITE_OTHER): Payer: Medicare Other | Admitting: Physical Therapy

## 2017-11-02 DIAGNOSIS — R29898 Other symptoms and signs involving the musculoskeletal system: Secondary | ICD-10-CM

## 2017-11-02 DIAGNOSIS — M79604 Pain in right leg: Secondary | ICD-10-CM | POA: Diagnosis not present

## 2017-11-02 NOTE — Therapy (Signed)
[place], [place], [address] Phone: [phone]   Fax:  [phone]  Physical Therapy Treatment  Patient Details  Name: Alexis Small MRN: [id number] Date of Birth: 03-Jul-1940 Referring Provider: Dr. Madilyn Fireman   Encounter Date: 11/02/2017  PT End of Session - 11/02/17 0952    Visit Number  5    Number of Visits  12    Date for PT Re-Evaluation  11/24/17    PT Start Time  0932    PT Stop Time  1026    PT Time Calculation (min)  54 min    Activity Tolerance  Patient tolerated treatment well    Behavior During Therapy  [facility] for tasks assessed/performed       Past Medical History:  Diagnosis Date  . Shingles     Past Surgical History:  Procedure Laterality Date  . ABDOMINAL HYSTERECTOMY  1970's   Complete, 2x  . APPENDECTOMY  1970's  . BREAST SURGERY  1980's   reduction  . CORNEAL TRANSPLANT  11/2014   [facility]    There were no vitals filed for this visit.  Subjective Assessment - 11/02/17 0934    Subjective  Pt didn't have any skin irritation from ionto patch.  She thinks it may have helped a little. She has been doing her exercises every day.   Last night she couldn't sleep due to pain 8/10.     Patient Stated Goals  get rid of the hip pain     Currently in Pain?  No/denies    Pain Score  0-No pain    Pain Orientation  Right    Pain Radiating Towards  down to lateral Rt calf         [facility] PT Assessment - 11/02/17 0001      Assessment   Medical Diagnosis  Rt hip/LE pain     Referring Provider  Dr. Madilyn Fireman    Onset Date/Surgical Date  08/21/16    Hand Dominance  Right    Next MD Visit  PRN       Palpation   Palpation comment  point tender Rt mid-distal ITB       [facility] Adult PT Treatment/Exercise - 11/02/17 0001      Lumbar Exercises: Stretches   Hip Flexor Stretch  Right;2 reps;30 seconds   arm overhead, seated   Quad Stretch  Right;2 reps;30 seconds    ITB Stretch  Right;2 reps;30 seconds   with opp knee bent   Passive Hamstring Stretch  Right;2 reps;30 seconds   supine with strap   Piriformis Stretch  Right;3 reps;30 seconds      Knee/Hip Exercises: Supine   Bridges  Strengthening;1 set;5 reps   with ball squeeze, 5 sec hold     Moist Heat Therapy   Number Minutes Moist Heat  15 Minutes    Moist Heat Location  Hip;Lumbar Spine   bilat glutes, ant Rt hip     Electrical Stimulation   Electrical Stimulation Location  Rt piriformis/  Rt lateral quad and lateral hamstring    Electrical Stimulation Action  IFC    Electrical Stimulation Parameters  to tolerance    Electrical Stimulation Goals  Tone;Pain      Iontophoresis   Type of Iontophoresis  Dexamethasone    Location  origin of Rt piriformis    Dose  1.0 cc    Time  6 hr patch -80 mA stat patch  Manual Therapy   Soft tissue mobilization  STM to Rt iliopsoas, glute (max/med), piriformis, lateral hamstring, ITB. (light pressure throughout, some mild guarding)          PT Long Term Goals - 11/02/17 0950      PT LONG TERM GOAL #1   Title  Decreased aching in Rt LE by 50-75% allowing patient to perform normal functional activities 11/24/17    Time  6    Period  Weeks    Status  On-going   fluctuates     PT LONG TERM GOAL #2   Title  Patient reports abiltiy to sleep 4-5 hours without awakening due to pain 11/24/17    Time  6    Period  Weeks    Status  On-going   <3 hrs at a time.      PT LONG TERM GOAL #3   Title  Improve tissue extensibility with patient to demonstrate normal mobility Rt hip with piriformis stretch 11/24/17    Time  6    Period  Weeks            Plan - 11/02/17 1003    Clinical Impression Statement  Palpable tightness and tenderness in Rt iliopsoas, piriformis, mid ITB, glute med; slightly reduced with gentle manual therapy.  Pt has had fluctuating symptoms since initiating therapy.  Sleep continues to be problematic.  Pt to see  supervising PT next visit.     Rehab Potential  Good    PT Frequency  2x / week    PT Duration  6 weeks    PT Treatment/Interventions  Patient/family education;ADLs/Self Care Home Management;Cryotherapy;Electrical Stimulation;Iontophoresis 4mg /ml Dexamethasone;Moist Heat;Ultrasound;Dry needling;Manual techniques;Neuromuscular re-education;Therapeutic activities;Therapeutic exercise;Balance training    PT Next Visit Plan  manual therapy/DN; continue hip stretches.     Consulted and Agree with Plan of Care  Patient       Patient will benefit from skilled therapeutic intervention in order to improve the following deficits and impairments:  Postural dysfunction, Improper body mechanics, Pain, Increased fascial restricitons, Increased muscle spasms, Decreased mobility, Decreased activity tolerance  Visit Diagnosis: Pain in right leg  Other symptoms and signs involving the musculoskeletal system     Problem List Patient Active Problem List   Diagnosis Date Noted  . Vitamin D deficiency 11/13/2015  . Left foot pain 09/10/2015  . Wears hearing aid 02/05/2014  . FUCH'S DYSTROPHY 09/19/2008  . HEADACHE, CHRONIC 09/19/2008  . CARCINOMA, SKIN, SQUAMOUS CELL 03/05/2008  . OSTEOPOROSIS 03/05/2008   Kerin Perna, PTA 11/02/17 1:24 PM  [place], [place], [address] Phone: [phone]   Fax:  [phone]  Name: Alexis Small MRN: [id number] Date of Birth: 1940/07/24

## 2017-11-08 ENCOUNTER — Encounter: Payer: Self-pay | Admitting: Rehabilitative and Restorative Service Providers"

## 2017-11-08 ENCOUNTER — Ambulatory Visit: Payer: Medicare Other | Admitting: Rehabilitative and Restorative Service Providers"

## 2017-11-08 DIAGNOSIS — M79604 Pain in right leg: Secondary | ICD-10-CM | POA: Diagnosis not present

## 2017-11-08 DIAGNOSIS — R29898 Other symptoms and signs involving the musculoskeletal system: Secondary | ICD-10-CM

## 2017-11-08 NOTE — Patient Instructions (Addendum)
Abdominal Bracing With Pelvic Floor (Hook-Lying) Core exercise     With neutral spine, tighten pelvic floor and abdominals sucking belly button to back bone; tighten muscles in low back at waist. Hold 10 sec  Repeat _10__ times. Do _several __ times a day. Progress to do this in sitting; standing; walking; and with functional activities.  Keep the core tight with gym programs    Bent Leg Lift (Hook-Lying)    Tighten core and slowly raise right leg _10-12___ inches from floor. Keep trunk rigid. Hold _2-3___ seconds. Repeat _10___ times per set. Do __1-2__ sets per session. Do __1__ sessions per day.  Combination (Hook-Lying)    Tighten core and slowly raise left leg and lower opposite arm over head. Keep trunk rigid. Repeat __10__ times per set. Do __1-2__ sets per session. Do __1__ sessions per day.   Bridging    Slowly raise buttocks from floor, keeping core tight. Repeat __10__ times per set. Do ___1-2_ sets per session. Do __1__ sessions per day.    Strengthening: Hip Abductor - Resisted    With band looped around both legs above knees, push thighs apart. Repeat _10___ times per set. Do __1-2__ sets per session. Do __1__ sessions per day.  Keep doing your stretches  Add diagonal knee to chest to stretch the outside of the hip    Do not twist from back!!!! Especially avoid bent forward and twisted   Posture Tips DO: - stand tall and erect - keep chin tucked in - keep head and shoulders in alignment - check posture regularly in mirror or large window - pull head back against headrest in car seat;  Change your position often.  Sit with lumbar support. DON'T: - slouch or slump while watching TV or reading - sit, stand or lie in one position  for too long;  Sitting is especially hard on the spine so if you sit at a desk/use the computer, then stand up often!    Sleeping on Back  Place pillow under knees. A pillow with cervical support and a roll around waist are  also helpful. Copyright  VHI. All rights reserved.  Sleeping on Side Place pillow between knees. Use cervical support under neck and a roll around waist as needed. Copyright  VHI. All rights reserved.   Sleeping on Stomach   If this is the only desirable sleeping position, place pillow under lower legs, and under stomach or chest as needed.  Posture - Sitting   Sit upright, head facing forward. Try using a roll to support lower back. Keep shoulders relaxed, and avoid rounded back. Keep hips level with knees. Avoid crossing legs for long periods. Stand to Sit / Sit to Stand   To sit: Bend knees to lower self onto front edge of chair, then scoot back on seat. To stand: Reverse sequence by placing one foot forward, and scoot to front of seat. Use rocking motion to stand up.   Work Height and Reach  Ideal work height is no more than 2 to 4 inches below elbow level when standing, and at elbow level when sitting. Reaching should be limited to arm's length, with elbows slightly bent.  Bending  Bend at hips and knees, not back. Keep feet shoulder-width apart.    Posture - Standing   Good posture is important. Avoid slouching and forward head thrust. Maintain curve in low back and align ears over shoul- ders, hips over ankles.  Alternating Positions   Alternate tasks and change positions  frequently to reduce fatigue and muscle tension. Take rest breaks. Computer Work   Position work to Programmer, multimedia. Use proper work and seat height. Keep shoulders back and down, wrists straight, and elbows at right angles. Use chair that provides full back support. Add footrest and lumbar roll as needed.  Getting Into / Out of Car  Lower self onto seat, scoot back, then bring in one leg at a time. Reverse sequence to get out.  Dressing  Lie on back to pull socks or slacks over feet, or sit and bend leg while keeping back straight.    Housework - Sink  Place one foot on ledge of cabinet  under sink when standing at sink for prolonged periods.   Pushing / Pulling  Pushing is preferable to pulling. Keep back in proper alignment, and use leg muscles to do the work.  Deep Squat   Squat and lift with both arms held against upper trunk. Tighten stomach muscles without holding breath. Use smooth movements to avoid jerking.  Avoid Twisting   Avoid twisting or bending back. Pivot around using foot movements, and bend at knees if needed when reaching for articles.  Carrying Luggage   Distribute weight evenly on both sides. Use a cart whenever possible. Do not twist trunk. Move body as a unit.   Lifting Principles .Maintain proper posture and head alignment. .Slide object as close as possible before lifting. .Move obstacles out of the way. .Test before lifting; ask for help if too heavy. .Tighten stomach muscles without holding breath. .Use smooth movements; do not jerk. .Use legs to do the work, and pivot with feet. .Distribute the work load symmetrically and close to the center of trunk. .Push instead of pull whenever possible.   Ask For Help   Ask for help and delegate to others when possible. Coordinate your movements when lifting together, and maintain the low back curve.  Log Roll   Lying on back, bend left knee and place left arm across chest. Roll all in one movement to the right. Reverse to roll to the left. Always move as one unit. Housework - Sweeping  Use long-handled equipment to avoid stooping.   Housework - Wiping  Position yourself as close as possible to reach work surface. Avoid straining your back.  Laundry - Unloading Wash   To unload small items at bottom of washer, lift leg opposite to arm being used to reach.  Hannah close to area to be raked. Use arm movements to do the work. Keep back straight and avoid twisting.     Cart  When reaching into cart with one arm, lift opposite leg to keep back straight.    Getting Into / Out of Bed  Lower self to lie down on one side by raising legs and lowering head at the same time. Use arms to assist moving without twisting. Bend both knees to roll onto back if desired. To sit up, start from lying on side, and use same move-ments in reverse. Housework - Vacuuming  Hold the vacuum with arm held at side. Step back and forth to move it, keeping head up. Avoid twisting.   Laundry - IT consultant so that bending and twisting can be avoided.   Laundry - Unloading Dryer  Squat down to reach into clothes dryer or use a reacher.  Gardening - Weeding / Probation officer or Kneel. Knee pads may be helpful.

## 2017-11-08 NOTE — Therapy (Addendum)
[place], [place], [address] Phone: [phone]   Fax:  [phone]  Physical Therapy Treatment  Patient Details  Name: Alexis Small MRN: [id number] Date of Birth: 1941/01/14 Referring Provider: Dr Madilyn Fireman    Encounter Date: 11/08/2017  PT End of Session - 11/08/17 0931    Visit Number  6    Number of Visits  12    Date for PT Re-Evaluation  11/24/17    PT Start Time  0930    PT Stop Time  1025    PT Time Calculation (min)  55 min    Activity Tolerance  Patient tolerated treatment well       Past Medical History:  Diagnosis Date  . Shingles     Past Surgical History:  Procedure Laterality Date  . ABDOMINAL HYSTERECTOMY  1970's   Complete, 2x  . APPENDECTOMY  1970's  . BREAST SURGERY  1980's   reduction  . CORNEAL TRANSPLANT  11/2014   [facility]    There were no vitals filed for this visit.  Subjective Assessment - 11/08/17 0931    Subjective  Patient reports that she has not had any change in with therapy. She has slept well since Saturday. Sister suggested she try a chin strap at night to keep her mouth from opening. She can now sleep better on her back. She wants to try working out at [employer]     Currently in Pain?  No/denies         [facility] PT Assessment - 11/08/17 0001      Assessment   Medical Diagnosis  Rt hip/LE pain     Referring Provider  Dr Madilyn Fireman     Onset Date/Surgical Date  08/21/16    Hand Dominance  Right    Next MD Visit  PRN       Observation/Other Assessments   Focus on Therapeutic Outcomes (FOTO)   33% limitation       AROM   Lumbar Flexion  100%    Lumbar Extension  70%    Lumbar - Right Side Bend  75%    Lumbar - Left Side Bend  75%    Lumbar - Right Rotation  60%    Lumbar - Left Rotation  60%      Palpation   Palpation comment  point tender Rt mid-distal ITB                   [facility] Adult PT  Treatment/Exercise - 11/08/17 0001      Lumbar Exercises: Stretches   Other Lumbar Stretch Exercise  piriformis stretch 30 sec x 2; knee to opposite shoulder x 2 30 sec each bilat       Lumbar Exercises: Supine   AB Set Limitations  3 part core 10 sec x 10 - core engaged with all supine exercises noted below     Clam  10 reps;2 seconds   green TB   Bent Knee Raise  10 reps    Dead Bug  10 reps    Bridge  10 reps      Knee/Hip Exercises: Stretches   Passive Hamstring Stretch  Right;2 reps;30 seconds   supine with strap focus with full knee ext    Piriformis Stretch  Right;3 reps;30 seconds      Moist Heat Therapy   Number Minutes Moist Heat  20 Minutes    Moist Heat Location  Hip;Lumbar Spine  bilat glutes, ant Rt hip     Electrical Stimulation   Electrical Stimulation Location  Rt piriformis/  Rt lateral quad and lateral hamstring    Electrical Stimulation Action  IFC    Electrical Stimulation Parameters  to tolerance    Electrical Stimulation Goals  Tone;Pain             PT Education - 11/08/17 1012    Education Details  HEP back care     Person(s) Educated  Patient    Methods  Explanation;Demonstration;Tactile cues;Verbal cues;Handout    Comprehension  Verbalized understanding;Returned demonstration;Verbal cues required;Tactile cues required          PT Long Term Goals - 11/08/17 0955      PT LONG TERM GOAL #1   Title  Decreased aching in Rt LE by 50-75% allowing patient to perform normal functional activities 11/24/17    Time  6    Period  Weeks    Status  Partially Met      PT LONG TERM GOAL #2   Title  Patient reports abiltiy to sleep 4-5 hours without awakening due to pain 11/24/17    Time  6    Period  Weeks    Status  Achieved      PT LONG TERM GOAL #3   Title  Improve tissue extensibility with patient to demonstrate normal mobility Rt hip with piriformis stretch 11/24/17    Time  6    Period  Weeks    Status  Partially Met      PT LONG TERM  GOAL #4   Title  Independent in HEP 11/24/17    Time  6    Period  Weeks    Status  Achieved      PT LONG TERM GOAL #5   Title  Improve FOTO to </= 39% limitation 11/24/17    Time  6    Period  Weeks    Status  Achieved            Plan - 11/08/17 0951    Clinical Impression Statement  Patient reports continued intermittent symptoms with minimal change. She is sleeping better with chin strap to keep her mouth closed. Added core stabilization exercises and discussed gym program. Patient requests d/c to independent program. She is using her TENS unit at home.     Rehab Potential  Good    PT Frequency  2x / week    PT Duration  6 weeks    PT Treatment/Interventions  Patient/family education;ADLs/Self Care Home Management;Cryotherapy;Electrical Stimulation;Iontophoresis 15m/ml Dexamethasone;Moist Heat;Ultrasound;Dry needling;Manual techniques;Neuromuscular re-education;Therapeutic activities;Therapeutic exercise;Balance training    PT Next Visit Plan  d/c to independent HEP and gym program patient will call with any questions     Consulted and Agree with Plan of Care  Patient       Patient will benefit from skilled therapeutic intervention in order to improve the following deficits and impairments:  Postural dysfunction, Improper body mechanics, Pain, Increased fascial restricitons, Increased muscle spasms, Decreased mobility, Decreased activity tolerance  Visit Diagnosis: Pain in right leg  Other symptoms and signs involving the musculoskeletal system     Problem List Patient Active Problem List   Diagnosis Date Noted  . Vitamin D deficiency 11/13/2015  . Left foot pain 09/10/2015  . Wears hearing aid 02/05/2014  . FUCH'S DYSTROPHY 09/19/2008  . HEADACHE, CHRONIC 09/19/2008  . CARCINOMA, SKIN, SQUAMOUS CELL 03/05/2008  . OSTEOPOROSIS 03/05/2008    Alexis Small [name], Alexis Small  11/08/2017, 10:18 AM  [facility] [place]  [place], [place], [address] Phone: [phone]   Fax:  [phone]  Name: Alexis Small MRN: [id number] Date of Birth: 1941/01/07  PHYSICAL THERAPY DISCHARGE SUMMARY  Visits from Start of Care: 6  Current functional level related to goals / functional outcomes: See progress note for discharge status    Remaining deficits: Unknown    Education / Equipment: HEP  Plan: Patient agrees to discharge.  Patient goals were partially met. Patient is being discharged due to being pleased with the current functional level.  ?????     Alexis Small P. Helene Kelp PT, Alexis Small 01/11/18 11:30 AM

## 2017-12-08 NOTE — Progress Notes (Deleted)
Subjective:   NYIESHA BEEVER is a 77 y.o. female who presents for Medicare Annual (Subsequent) preventive examination.  Review of Systems:  No ROS.  Medicare Wellness Visit. Additional risk factors are reflected in the social history.    Sleep patterns:  Home Safety/Smoke Alarms: Feels safe in home. Smoke alarms in place.  Living environment;    Female:   Pap- aged out     Mammo-       Dexa scan-        CCS-     Objective:     Vitals: There were no vitals taken for this visit.  There is no height or weight on file to calculate BMI.  Advanced Directives 10/12/2017 02/05/2014  Does Patient Have a Medical Advance Directive? No No  Would patient like information on creating a medical advance directive? No - Patient declined No - patient declined information    Tobacco Social History   Tobacco Use  Smoking Status Never Smoker  Smokeless Tobacco Never Used     Counseling given: Not Answered   Clinical Intake:                       Past Medical History:  Diagnosis Date  . Shingles    Past Surgical History:  Procedure Laterality Date  . ABDOMINAL HYSTERECTOMY  1970's   Complete, 2x  . APPENDECTOMY  1970's  . BREAST SURGERY  1980's   reduction  . CORNEAL TRANSPLANT  11/2014   [facility]   Family History  Problem Relation Age of Onset  . Aneurysm Mother 32       cerebral  . Coronary artery disease Sister 82       deceased age 73  . COPD Sister        smoker  . Lung cancer Sister   . Birth defects Neg Hx    Social History   Socioeconomic History  . Marital status: Widowed    Spouse name: Not on file  . Number of children: 3  . Years of education: Not on file  . Highest education level: Not on file  Occupational History    Employer: [employer]  Social Needs  . Financial resource strain: Not on file  . Food insecurity:    Worry: Not on file    Inability: Not on file  . Transportation needs:    Medical: Not on  file    Non-medical: Not on file  Tobacco Use  . Smoking status: Never Smoker  . Smokeless tobacco: Never Used  Substance and Sexual Activity  . Alcohol use: No  . Drug use: No  . 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  Lifestyle  . Physical activity:    Days per week: Not on file    Minutes per session: Not on file  . Stress: Not on file  Relationships  . Social connections:    Talks on phone: Not on file    Gets together: Not on file    Attends religious service: Not on file    Active member of club or organization: Not on file    Attends meetings of clubs or organizations: Not on file    Relationship status: Not on file  Other Topics Concern  . Not on file  Social History Narrative   Walking for exercise.  No daily caffeine.     Outpatient Encounter Medications as  of 12/18/2017  Medication Sig  . AMBULATORY NON FORMULARY MEDICATION Take 2 tablets by mouth at bedtime. Medication Name: [facility] STRENGTH  . Omega 3-6-9 Fatty Acids (OMEGA 3-6-9 COMPLEX PO) Take by mouth.   No facility-administered encounter medications on file as of 12/18/2017.     Activities of Daily Living No flowsheet data found.  Patient Care Team: Hali Marry, MD as PCP - General    Assessment:   This is a routine wellness examination for [place].Physical assessment deferred to PCP.   Exercise Activities and Dietary recommendations   Diet  Breakfast: Lunch:  Dinner:       Goals   None     Fall Risk Fall Risk  10/30/2015 02/05/2014 02/04/2013 02/04/2013  Falls in the past year? No No No No   Is the patient's home free of loose throw rugs in walkways, pet beds, electrical cords, etc?   {Blank single:19197::"yes","no"}      Grab bars in the bathroom? {Blank single:19197::"yes","no"}      Handrails on the stairs?   {Blank single:19197::"yes","no"}      Adequate lighting?   {Blank  single:19197::"yes","no"}   Depression Screen PHQ 2/9 Scores 10/30/2015 02/05/2014 02/04/2013 02/04/2013  PHQ - 2 Score 0 0 0 0     Cognitive Function        Immunization History  Administered Date(s) Administered  . Pneumococcal Polysaccharide-23 12/15/2011  . Tdap 09/14/2010  . Zoster 09/14/2010     Screening Tests Health Maintenance  Topic Date Due  . PNA vac Low Risk Adult (2 of 2 - PCV13) 12/14/2012  . INFLUENZA VACCINE  02/21/2019 (Originally 09/21/2017)  . TETANUS/TDAP  09/13/2020  . DEXA SCAN  Completed      Plan:   ***   I have personally reviewed and noted the following in the patient's chart:   . Medical and social history . Use of alcohol, tobacco or illicit drugs  . Current medications and supplements . Functional ability and status . Nutritional status . Physical activity . Advanced directives . List of other physicians . Hospitalizations, surgeries, and ER visits in previous 12 months . Vitals . Screenings to include cognitive, depression, and falls . Referrals and appointments  In addition, I have reviewed and discussed with patient certain preventive protocols, quality metrics, and best practice recommendations. A written personalized care plan for preventive services as well as general preventive health recommendations were provided to patient.     Joanne Chars, LPN  [phone]/[phone]

## 2017-12-18 ENCOUNTER — Ambulatory Visit: Payer: Medicare Other

## 2018-01-15 ENCOUNTER — Telehealth: Payer: Self-pay

## 2018-01-15 MED ORDER — OSELTAMIVIR PHOSPHATE 75 MG PO CAPS: 75.0000 mg | ORAL_CAPSULE | Freq: Every day | ORAL | 0 refills | Status: DC

## 2018-01-15 NOTE — Telephone Encounter (Signed)
Patient advised.

## 2018-01-15 NOTE — Telephone Encounter (Signed)
rx sent

## 2018-01-15 NOTE — Telephone Encounter (Signed)
Legacie called and states she is taking care of her grandson that has tested positive for the flu. She would like Tamiflu sent in. Please advise.   CMP     Component Value Date/Time   NA 139 06/12/2017 1017   K 4.0 06/12/2017 1017   CL 104 06/12/2017 1017   CO2 29 06/12/2017 1017   GLUCOSE 93 06/12/2017 1017   BUN 15 06/12/2017 1017   CREATININE 0.79 06/12/2017 1017   CALCIUM 9.1 06/12/2017 1017   PROT 7.1 06/12/2017 1017   ALBUMIN 4.3 10/30/2015 1509   AST 15 06/12/2017 1017   ALT 10 06/12/2017 1017   ALKPHOS 71 10/30/2015 1509   BILITOT 1.0 06/12/2017 1017   GFRNONAA 73 06/12/2017 1017   GFRAA 84 06/12/2017 1017

## 2018-01-16 ENCOUNTER — Ambulatory Visit: Payer: Medicare Other

## 2018-01-22 NOTE — Progress Notes (Deleted)
Subjective:   Alexis Small is a 77 y.o. female who presents for Medicare Annual (Subsequent) preventive examination.  Review of Systems:  No ROS.  Medicare Wellness Visit. Additional risk factors are reflected in the social history.    Sleep patterns: {SX; SLEEP PATTERNS:18802::"feels rested on waking","does not get up to void","gets up *** times nightly to void","sleeps *** hours nightly"}.   Home Safety/Smoke Alarms: Feels safe in home. Smoke alarms in place.  Living environment; residence and Firearm Safety: {Rehab home environment / accessibility:30080::"no firearms","firearms stored safely"}. Seat Belt Safety/Bike Helmet: Wears seat belt.   Female:   Pap-   Aged out    Mammo-  utd     Dexa scan- utd       CCS-utd     Objective:     Vitals: There were no vitals taken for this visit.  There is no height or weight on file to calculate BMI.  Advanced Directives 10/12/2017 02/05/2014  Does Patient Have a Medical Advance Directive? No No  Would patient like information on creating a medical advance directive? No - Patient declined No - patient declined information    Tobacco Social History   Tobacco Use  Smoking Status Never Smoker  Smokeless Tobacco Never Used     Counseling given: Not Answered   Clinical Intake:                       Past Medical History:  Diagnosis Date  . Shingles    Past Surgical History:  Procedure Laterality Date  . ABDOMINAL HYSTERECTOMY  1970's   Complete, 2x  . APPENDECTOMY  1970's  . BREAST SURGERY  1980's   reduction  . CORNEAL TRANSPLANT  11/2014   [facility]   Family History  Problem Relation Age of Onset  . Aneurysm Mother 42       cerebral  . Coronary artery disease Sister 46       deceased age 41  . COPD Sister        smoker  . Lung cancer Sister   . Birth defects Neg Hx    Social History   Socioeconomic History  . Marital status: Widowed    Spouse name: Not on file  . Number of  children: 3  . Years of education: Not on file  . Highest education level: Not on file  Occupational History    Employer: [employer]  Social Needs  . Financial resource strain: Not on file  . Food insecurity:    Worry: Not on file    Inability: Not on file  . Transportation needs:    Medical: Not on file    Non-medical: Not on file  Tobacco Use  . Smoking status: Never Smoker  . Smokeless tobacco: Never Used  Substance and Sexual Activity  . Alcohol use: No  . Drug use: No  . 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  Lifestyle  . Physical activity:    Days per week: Not on file    Minutes per session: Not on file  . Stress: Not on file  Relationships  . Social connections:    Talks on phone: Not on file    Gets together: Not on file    Attends religious service: Not on file    Active member of club or organization: Not on file    Attends meetings of clubs or organizations: Not  on file    Relationship status: Not on file  Other Topics Concern  . Not on file  Social History Narrative   Walking for exercise.  No daily caffeine.     Outpatient Encounter Medications as of 01/23/2018  Medication Sig  . AMBULATORY NON FORMULARY MEDICATION Take 2 tablets by mouth at bedtime. Medication Name: [facility] STRENGTH  . Omega 3-6-9 Fatty Acids (OMEGA 3-6-9 COMPLEX PO) Take by mouth.  . oseltamivir (TAMIFLU) 75 MG capsule Take 1 capsule (75 mg total) by mouth daily. X 10 days   No facility-administered encounter medications on file as of 01/23/2018.     Activities of Daily Living No flowsheet data found.  Patient Care Team: Hali Marry, MD as PCP - General    Assessment:   This is a routine wellness examination for [name].Physical assessment deferred to PCP.   Exercise Activities and Dietary recommendations   Diet  Breakfast: Lunch:  Dinner:       Goals   None     Fall  Risk Fall Risk  10/30/2015 02/05/2014 02/04/2013 02/04/2013  Falls in the past year? No No No No   Is the patient's home free of loose throw rugs in walkways, pet beds, electrical cords, etc?   {Blank single:19197::"yes","no"}      Grab bars in the bathroom? {Blank single:19197::"yes","no"}      Handrails on the stairs?   {Blank single:19197::"yes","no"}      Adequate lighting?   {Blank single:19197::"yes","no"}   Depression Screen PHQ 2/9 Scores 10/30/2015 02/05/2014 02/04/2013 02/04/2013  PHQ - 2 Score 0 0 0 0     Cognitive Function        Immunization History  Administered Date(s) Administered  . Pneumococcal Polysaccharide-23 12/15/2011  . Tdap 09/14/2010  . Zoster 09/14/2010    Screening Tests Health Maintenance  Topic Date Due  . PNA vac Low Risk Adult (2 of 2 - PCV13) 12/14/2012  . INFLUENZA VACCINE  02/21/2019 (Originally 09/21/2017)  . TETANUS/TDAP  09/13/2020  . DEXA SCAN  Completed        Plan:   ***   I have personally reviewed and noted the following in the patient's chart:   . Medical and social history . Use of alcohol, tobacco or illicit drugs  . Current medications and supplements . Functional ability and status . Nutritional status . Physical activity . Advanced directives . List of other physicians . Hospitalizations, surgeries, and ER visits in previous 12 months . Vitals . Screenings to include cognitive, depression, and falls . Referrals and appointments  In addition, I have reviewed and discussed with patient certain preventive protocols, quality metrics, and best practice recommendations. A written personalized care plan for preventive services as well as general preventive health recommendations were provided to patient.     Joanne Chars, LPN  [phone]

## 2018-01-23 ENCOUNTER — Ambulatory Visit: Payer: Medicare Other

## 2018-03-06 ENCOUNTER — Encounter: Payer: Medicare Other | Admitting: Family Medicine

## 2018-04-03 ENCOUNTER — Encounter: Payer: Self-pay | Admitting: Family Medicine

## 2018-04-03 ENCOUNTER — Ambulatory Visit (INDEPENDENT_AMBULATORY_CARE_PROVIDER_SITE_OTHER): Payer: Medicare Other | Admitting: Family Medicine

## 2018-04-03 VITALS — BP 131/69 | HR 77 | Ht 61.0 in | Wt 133.0 lb

## 2018-04-03 DIAGNOSIS — M85859 Other specified disorders of bone density and structure, unspecified thigh: Secondary | ICD-10-CM | POA: Diagnosis not present

## 2018-04-03 DIAGNOSIS — Z0000 Encounter for general adult medical examination without abnormal findings: Secondary | ICD-10-CM

## 2018-04-03 NOTE — Progress Notes (Addendum)
Subjective:     Alexis Small is a 78 y.o. female and is here for a comprehensive physical exam. The patient reports no problems. She no longer wants to do breast exams.  She is ok with a bone denisty. She reporst she taker calcium with vitamin D daily.  It is called Skeletal Strength and says she has taken it for years.    Social History   Socioeconomic History  . Marital status: Widowed    Spouse name: Not on file  . Number of children: 3  . Years of education: Not on file  . Highest education level: Not on file  Occupational History    Employer: [employer]  Social Needs  . Financial resource strain: Not on file  . Food insecurity:    Worry: Not on file    Inability: Not on file  . Transportation needs:    Medical: Not on file    Non-medical: Not on file  Tobacco Use  . Smoking status: Never Smoker  . Smokeless tobacco: Never Used  Substance and Sexual Activity  . Alcohol use: No  . Drug use: No  . 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  Lifestyle  . Physical activity:    Days per week: Not on file    Minutes per session: Not on file  . Stress: Not on file  Relationships  . Social connections:    Talks on phone: Not on file    Gets together: Not on file    Attends religious service: Not on file    Active member of club or organization: Not on file    Attends meetings of clubs or organizations: Not on file    Relationship status: Not on file  . Intimate partner violence:    Fear of current or ex partner: Not on file    Emotionally abused: Not on file    Physically abused: Not on file    Forced sexual activity: Not on file  Other Topics Concern  . Not on file  Social History Narrative   Walking for exercise.  No daily caffeine.    Health Maintenance  Topic Date Due  . PNA vac Low Risk Adult (2 of 2 - PCV13) 12/14/2012  . INFLUENZA VACCINE  02/21/2019 (Originally 09/21/2017)  .  TETANUS/TDAP  09/13/2020  . DEXA SCAN  Completed    The following portions of the patient's history were reviewed and updated as appropriate: allergies, current medications, past family history, past medical history, past social history, past surgical history and problem list.  Review of Systems A comprehensive review of systems was negative.   Objective:    BP 131/69   Pulse 77   Ht 5\' 1"  (1.549 m)   Wt 133 lb (60.3 kg)   SpO2 100%   BMI 25.13 kg/m  General appearance: alert, cooperative and appears stated age Head: Normocephalic, without obvious abnormality, atraumatic Eyes: conj clear, EOMI, PEERLA Ears: normal TM's and external ear canals both ears Nose: Nares normal. Septum midline. Mucosa normal. No drainage or sinus tenderness. Throat: lips, mucosa, and tongue normal; teeth and gums normal Neck: no adenopathy, no carotid bruit, no JVD, supple, symmetrical, trachea midline and thyroid not enlarged, symmetric, no tenderness/mass/nodules Back: symmetric, no curvature. ROM normal. No CVA tenderness. Lungs: clear to auscultation bilaterally Breasts: normal appearance, no masses or tenderness Heart: regular rate and rhythm, S1, S2 normal, no murmur, click, rub or gallop  Abdomen: soft, non-tender; bowel sounds normal; no masses,  no organomegaly Extremities: extremities normal, atraumatic, no cyanosis or edema Pulses: 2+ and symmetric Skin: Skin color, texture, turgor normal. No rashes or lesions Lymph nodes: Cervical, supraclavicular, and axillary nodes normal. Neurologic: Alert and oriented X 3, normal strength and tone. Normal symmetric reflexes. Normal coordination and gait    Assessment:    Healthy female exam.      Plan:     See After Visit Summary for Counseling Recommendations   Keep up a regular exercise program and make sure you are eating a healthy diet Try to eat 4 servings of dairy a day, or if you are lactose intolerant take a calcium with vitamin D daily.   Your vaccines are up to date.   Osteopenia  - Bone density ordered.  She does take calcium and vitamin D.

## 2018-04-03 NOTE — Patient Instructions (Signed)
Preventive Care 78 Years and Older, Female Preventive care refers to lifestyle choices and visits with your health care provider that can promote health and wellness. What does preventive care include?  A yearly physical exam. This is also called an annual well check.  Dental exams once or twice a year.  Routine eye exams. Ask your health care provider how often you should have your eyes checked.  Personal lifestyle choices, including: ? Daily care of your teeth and gums. ? Regular physical activity. ? Eating a healthy diet. ? Avoiding tobacco and drug use. ? Limiting alcohol use. ? Practicing safe sex. ? Taking low-dose aspirin every day. ? Taking vitamin and mineral supplements as recommended by your health care provider. What happens during an annual well check? The services and screenings done by your health care provider during your annual well check will depend on your age, overall health, lifestyle risk factors, and family history of disease. Counseling Your health care provider may ask you questions about your:  Alcohol use.  Tobacco use.  Drug use.  Emotional well-being.  Home and relationship well-being.  Sexual activity.  Eating habits.  History of falls.  Memory and ability to understand (cognition).  Work and work Statistician.  Reproductive health.  Screening You may have the following tests or measurements:  Height, weight, and BMI.  Blood pressure.  Lipid and cholesterol levels. These may be checked every 5 years, or more frequently if you are over 30 years old.  Skin check.  Lung cancer screening. You may have this screening every year starting at age 27 if you have a 30-pack-year history of smoking and currently smoke or have quit within the past 15 years.  Colorectal cancer screening. All adults should have this screening starting at age 33 and continuing until age 46. You will have tests every 1-10 years, depending on your results and the  type of screening test. People at increased risk should start screening at an earlier age. Screening tests may include: ? Guaiac-based fecal occult blood testing. ? Fecal immunochemical test (FIT). ? Stool DNA test. ? Virtual colonoscopy. ? Sigmoidoscopy. During this test, a flexible tube with a tiny camera (sigmoidoscope) is used to examine your rectum and lower colon. The sigmoidoscope is inserted through your anus into your rectum and lower colon. ? Colonoscopy. During this test, a long, thin, flexible tube with a tiny camera (colonoscope) is used to examine your entire colon and rectum.  Hepatitis C blood test.  Hepatitis B blood test.  Sexually transmitted disease (STD) testing.  Diabetes screening. This is done by checking your blood sugar (glucose) after you have not eaten for a while (fasting). You may have this done every 1-3 years.  Bone density scan. This is done to screen for osteoporosis. You may have this done starting at age 37.  Mammogram. This may be done every 1-2 years. Talk to your health care provider about how often you should have regular mammograms. Talk with your health care provider about your test results, treatment options, and if necessary, the need for more tests. Vaccines Your health care provider may recommend certain vaccines, such as:  Influenza vaccine. This is recommended every year.  Tetanus, diphtheria, and acellular pertussis (Tdap, Td) vaccine. You may need a Td booster every 10 years.  Varicella vaccine. You may need this if you have not been vaccinated.  Zoster vaccine. You may need this after age 38.  Measles, mumps, and rubella (MMR) vaccine. You may need at least  one dose of MMR if you were born in 1957 or later. You may also need a second dose.  Pneumococcal 13-valent conjugate (PCV13) vaccine. One dose is recommended after age 24.  Pneumococcal polysaccharide (PPSV23) vaccine. One dose is recommended after age 24.  Meningococcal  vaccine. You may need this if you have certain conditions.  Hepatitis A vaccine. You may need this if you have certain conditions or if you travel or work in places where you may be exposed to hepatitis A.  Hepatitis B vaccine. You may need this if you have certain conditions or if you travel or work in places where you may be exposed to hepatitis B.  Haemophilus influenzae type b (Hib) vaccine. You may need this if you have certain conditions. Talk to your health care provider about which screenings and vaccines you need and how often you need them. This information is not intended to replace advice given to you by your health care provider. Make sure you discuss any questions you have with your health care provider. Document Released: 03/06/2015 Document Revised: 03/30/2017 Document Reviewed: 12/09/2014 Elsevier Interactive Patient Education  2019 [employer].

## 2018-04-18 ENCOUNTER — Ambulatory Visit (INDEPENDENT_AMBULATORY_CARE_PROVIDER_SITE_OTHER): Payer: Medicare Other

## 2018-04-18 DIAGNOSIS — M810 Age-related osteoporosis without current pathological fracture: Secondary | ICD-10-CM | POA: Diagnosis not present

## 2018-04-18 DIAGNOSIS — Z780 Asymptomatic menopausal state: Secondary | ICD-10-CM | POA: Diagnosis not present

## 2018-04-18 DIAGNOSIS — M85859 Other specified disorders of bone density and structure, unspecified thigh: Secondary | ICD-10-CM

## 2018-04-19 DIAGNOSIS — Z0000 Encounter for general adult medical examination without abnormal findings: Secondary | ICD-10-CM | POA: Diagnosis not present

## 2018-04-20 ENCOUNTER — Encounter: Payer: Self-pay | Admitting: Family Medicine

## 2018-04-20 DIAGNOSIS — M8000XA Age-related osteoporosis with current pathological fracture, unspecified site, initial encounter for fracture: Secondary | ICD-10-CM | POA: Insufficient documentation

## 2018-04-20 DIAGNOSIS — M810 Age-related osteoporosis without current pathological fracture: Secondary | ICD-10-CM | POA: Insufficient documentation

## 2018-04-20 LAB — COMPLETE METABOLIC PANEL WITH GFR
AG Ratio: 1.4 (calc) (ref 1.0–2.5)
ALKALINE PHOSPHATASE (APISO): 77 U/L (ref 37–153)
ALT: 9 U/L (ref 6–29)
AST: 15 U/L (ref 10–35)
Albumin: 4 g/dL (ref 3.6–5.1)
BILIRUBIN TOTAL: 0.5 mg/dL (ref 0.2–1.2)
BUN: 16 mg/dL (ref 7–25)
CHLORIDE: 104 mmol/L (ref 98–110)
CO2: 27 mmol/L (ref 20–32)
CREATININE: 0.9 mg/dL (ref 0.60–0.93)
Calcium: 9.3 mg/dL (ref 8.6–10.4)
GFR, Est African American: 71 mL/min/{1.73_m2} (ref >=60)
GFR, Est Non African American: 62 mL/min/{1.73_m2} (ref >=60)
GLUCOSE: 83 mg/dL (ref 65–99)
Globulin: 2.9 g/dL (calc) (ref 1.9–3.7)
Potassium: 4.1 mmol/L (ref 3.5–5.3)
Sodium: 139 mmol/L (ref 135–146)
Total Protein: 6.9 g/dL (ref 6.1–8.1)

## 2018-04-20 LAB — CBC
HCT: 39 % (ref 35.0–45.0)
HEMOGLOBIN: 13.1 g/dL (ref 11.7–15.5)
MCH: 29.6 pg (ref 27.0–33.0)
MCHC: 33.6 g/dL (ref 32.0–36.0)
MCV: 88.2 fL (ref 80.0–100.0)
MPV: 11.2 fL (ref 7.5–12.5)
Platelets: 210 10*3/uL (ref 140–400)
RBC: 4.42 10*6/uL (ref 3.80–5.10)
RDW: 12.6 % (ref 11.0–15.0)
WBC: 5.3 10*3/uL (ref 3.8–10.8)

## 2018-04-20 LAB — LIPID PANEL
CHOL/HDL RATIO: 2 (calc) (ref <5.0)
CHOLESTEROL: 166 mg/dL (ref <200)
HDL: 82 mg/dL (ref >=50)
LDL Cholesterol (Calc): 67 mg/dL (calc)
Non-HDL Cholesterol (Calc): 84 mg/dL (calc) (ref <130)
Triglycerides: 90 mg/dL (ref <150)

## 2018-05-10 ENCOUNTER — Telehealth: Payer: Self-pay

## 2018-05-10 NOTE — Telephone Encounter (Signed)
Alexis Small called and left a message asking for a return call. She wants to know what is her blood type. She wants to know because due to the outbreak of COVID-19.   I left a message for a return call.   The information does come from a study, but one that has not yet been peer-reviewed.

## 2018-05-10 NOTE — Telephone Encounter (Signed)
Pricilla called back to ask if she could take Zinc with her current medications. I advised it would be ok to take Zinc.   She also asked about her blood type. I advised her we do not check for blood typing in a office setting. I told her that it would not change how she would be treated for the COVID-19.

## 2018-05-20 DIAGNOSIS — M8580 Other specified disorders of bone density and structure, unspecified site: Secondary | ICD-10-CM | POA: Diagnosis not present

## 2018-05-20 DIAGNOSIS — Z043 Encounter for examination and observation following other accident: Secondary | ICD-10-CM | POA: Diagnosis not present

## 2018-05-20 DIAGNOSIS — S6391XA Sprain of unspecified part of right wrist and hand, initial encounter: Secondary | ICD-10-CM | POA: Diagnosis not present

## 2018-05-20 DIAGNOSIS — S8001XA Contusion of right knee, initial encounter: Secondary | ICD-10-CM | POA: Diagnosis not present

## 2018-05-20 DIAGNOSIS — R52 Pain, unspecified: Secondary | ICD-10-CM | POA: Diagnosis not present

## 2018-05-20 DIAGNOSIS — T07XXXA Unspecified multiple injuries, initial encounter: Secondary | ICD-10-CM | POA: Diagnosis not present

## 2018-05-20 DIAGNOSIS — R58 Hemorrhage, not elsewhere classified: Secondary | ICD-10-CM | POA: Diagnosis not present

## 2018-05-20 DIAGNOSIS — S80919A Unspecified superficial injury of unspecified knee, initial encounter: Secondary | ICD-10-CM | POA: Diagnosis not present

## 2018-05-20 DIAGNOSIS — Z882 Allergy status to sulfonamides status: Secondary | ICD-10-CM | POA: Diagnosis not present

## 2018-05-20 DIAGNOSIS — Z886 Allergy status to analgesic agent status: Secondary | ICD-10-CM | POA: Diagnosis not present

## 2018-05-20 DIAGNOSIS — R609 Edema, unspecified: Secondary | ICD-10-CM | POA: Diagnosis not present

## 2018-05-20 DIAGNOSIS — S63501A Unspecified sprain of right wrist, initial encounter: Secondary | ICD-10-CM | POA: Diagnosis not present

## 2018-05-20 DIAGNOSIS — Z23 Encounter for immunization: Secondary | ICD-10-CM | POA: Diagnosis not present

## 2018-05-25 ENCOUNTER — Ambulatory Visit (INDEPENDENT_AMBULATORY_CARE_PROVIDER_SITE_OTHER): Payer: Medicare Other | Admitting: Family Medicine

## 2018-05-25 ENCOUNTER — Other Ambulatory Visit: Payer: Self-pay

## 2018-05-25 ENCOUNTER — Encounter: Payer: Self-pay | Admitting: Family Medicine

## 2018-05-25 VITALS — BP 144/60 | HR 71 | Ht 61.0 in | Wt 134.0 lb

## 2018-05-25 DIAGNOSIS — M25561 Pain in right knee: Secondary | ICD-10-CM

## 2018-05-25 DIAGNOSIS — H5712 Ocular pain, left eye: Secondary | ICD-10-CM | POA: Diagnosis not present

## 2018-05-25 DIAGNOSIS — W101XXA Fall (on)(from) sidewalk curb, initial encounter: Secondary | ICD-10-CM | POA: Diagnosis not present

## 2018-05-25 DIAGNOSIS — S0083XA Contusion of other part of head, initial encounter: Secondary | ICD-10-CM | POA: Diagnosis not present

## 2018-05-25 DIAGNOSIS — M25531 Pain in right wrist: Secondary | ICD-10-CM

## 2018-05-25 NOTE — Progress Notes (Signed)
Acute Office Visit  Subjective:    Patient ID: Alexis Small, female    DOB: Aug 13, 1940, 78 y.o.   MRN: [id number]  Chief Complaint  Patient presents with  . Knee Pain    fell and injured R knee    HPI 78 year old female comes in today for right knee, right wrist and left brow ridge pain.  She reports that her family dog got loose and she was outside and tripped and fell trying to run after the dog which started in front of her.  She went down on her right knee right hip and right wrist.  A couple of days prior to that injury she was trying to move the blinds in her grandsons room and it hit her just above her eye on the left side over the eyebrow ridge and she is had some swelling and bruising since then.  She denies any visual changes and says it has been getting gradually better.  She did have x-rays done in the emergency department at [facility].  She had normal x-rays of the left hand wrist and right knee.  She says she clean the initial abrasions on the knee and wrist with alcohol.  Since then she has had significant swelling in her right knee and it has been hard to flex.  It is also been difficult to walk on it she has been but says that it starts to get more more painful.  She was given an Ace wrap which she has been using though she did not wrap it today.  She said she iced it initially but has been doing more warm moist heat.  She is not taking any medication for it right now.  Past Medical History:  Diagnosis Date  . Osteopenia 03/05/2008   Qualifier: Diagnosis of  By: Madilyn Fireman MD, Barnetta Chapel    . Shingles     Past Surgical History:  Procedure Laterality Date  . ABDOMINAL HYSTERECTOMY  1970's   Complete, 2x  . APPENDECTOMY  1970's  . BREAST SURGERY  1980's   reduction  . CORNEAL TRANSPLANT  11/2014   [facility]    Family History  Problem Relation Age of Onset  . Aneurysm Mother 55       cerebral  . Coronary artery disease Sister 50       deceased age  21  . COPD Sister        smoker  . Lung cancer Sister   . Birth defects Neg Hx     Social History   Socioeconomic History  . Marital status: Widowed    Spouse name: Not on file  . Number of children: 3  . Years of education: Not on file  . Highest education level: Not on file  Occupational History    Employer: [employer]  Social Needs  . Financial resource strain: Not on file  . Food insecurity:    Worry: Not on file    Inability: Not on file  . Transportation needs:    Medical: Not on file    Non-medical: Not on file  Tobacco Use  . Smoking status: Never Smoker  . Smokeless tobacco: Never Used  Substance and Sexual Activity  . Alcohol use: No  . Drug use: No  . 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  Lifestyle  . Physical activity:    Days per week: Not on file    Minutes  per session: Not on file  . Stress: Not on file  Relationships  . Social connections:    Talks on phone: Not on file    Gets together: Not on file    Attends religious service: Not on file    Active member of club or organization: Not on file    Attends meetings of clubs or organizations: Not on file    Relationship status: Not on file  . Intimate partner violence:    Fear of current or ex partner: Not on file    Emotionally abused: Not on file    Physically abused: Not on file    Forced sexual activity: Not on file  Other Topics Concern  . Not on file  Social History Narrative   Walking for exercise.  No daily caffeine.     Outpatient Medications Prior to Visit  Medication Sig Dispense Refill  . AMBULATORY NON FORMULARY MEDICATION Take 2 tablets by mouth at bedtime. Medication Name: [facility] STRENGTH    . Calcium Carbonate-Vitamin D 600-200 MG-UNIT TABS Take 1 tablet by mouth at bedtime.    . Omega 3-6-9 Fatty Acids (OMEGA 3-6-9 COMPLEX PO) Take by mouth.     No facility-administered medications prior to  visit.     Allergies  Allergen Reactions  . Oxycodone-Acetaminophen     Other reaction(s): Other (See Comments) "[place]"  . Prednisone Other (See Comments)    Sweats, chest pain, etc  . Sulfamethoxazole     Other reaction(s): Other (See Comments) unknown    ROS     Objective:    Physical Exam  Constitutional: She is oriented to person, place, and time. She appears well-developed and well-nourished.  HENT:  Head: Normocephalic and atraumatic.  Right Ear: External ear normal.  Left Ear: External ear normal.  Over the left eyebrow ridge she has significant swelling and bruising that has tracked down into the left upper eyelid.  Eyes: Conjunctivae and EOM are normal. Right eye exhibits no discharge. Left eye exhibits no discharge.  Cardiovascular: Normal rate.  Pulmonary/Chest: Effort normal.  Musculoskeletal:     Comments: Right knee has significant swelling and bruising.  She is only able to flex to about 90 degrees.  No increased laxity with anterior drawer.  She is mildly tender over the joint lines.  She complains of pain over the calf muscle but had no increased discomfort with squeezing of the calf muscle.  She was tender over the right greater trochanter.  Strength with flexion and extension was 4-5 with both compared to 5 out of 5 on her left knee.  Right wrist with normal range of motion.  Normal range of motion of the fingers as well as strength with grip.  She does have significant swelling and bruising anteriorly and posteriorly over the wrist as well as over the base of her thumb.  Neurological: She is alert and oriented to person, place, and time.  Skin: Skin is dry. No pallor.  Psychiatric: She has a normal mood and affect. Her behavior is normal.  Vitals reviewed.   BP (!) 144/60   Pulse 71   Ht 5\' 1"  (1.549 m)   Wt 134 lb (60.8 kg)   SpO2 99%   BMI 25.32 kg/m  Wt Readings from Last 3 Encounters:  05/25/18 134 lb (60.8 kg)  04/03/18 133 lb (60.3  kg)  06/12/17 136 lb (61.7 kg)    Health Maintenance Due  Topic Date Due  . PNA vac Low Risk  Adult (2 of 2 - PCV13) 12/14/2012    There are no preventive care reminders to display for this patient.   Lab Results  Component Value Date   TSH 1.40 06/12/2017   Lab Results  Component Value Date   WBC 5.3 04/19/2018   HGB 13.1 04/19/2018   HCT 39.0 04/19/2018   MCV 88.2 04/19/2018   PLT 210 04/19/2018   Lab Results  Component Value Date   NA 139 04/19/2018   K 4.1 04/19/2018   CO2 27 04/19/2018   GLUCOSE 83 04/19/2018   BUN 16 04/19/2018   CREATININE 0.90 04/19/2018   BILITOT 0.5 04/19/2018   ALKPHOS 71 10/30/2015   AST 15 04/19/2018   ALT 9 04/19/2018   PROT 6.9 04/19/2018   ALBUMIN 4.3 10/30/2015   CALCIUM 9.3 04/19/2018   Lab Results  Component Value Date   CHOL 166 04/19/2018   Lab Results  Component Value Date   HDL 82 04/19/2018   Lab Results  Component Value Date   LDLCALC 67 04/19/2018   Lab Results  Component Value Date   TRIG 90 04/19/2018   Lab Results  Component Value Date   CHOLHDL 2.0 04/19/2018   No results found for: HGBA1C     Assessment & Plan:   Problem List Items Addressed This Visit    None    Visit Diagnoses    Acute pain of right knee    -  Primary   Fall (on)(from) sidewalk curb, initial encounter       Right wrist pain       Left eye pain       Contusion of face, initial encounter        Acute right knee pain-still significantly swollen.  I want her to switch back to icing it a couple times a day trying to elevate it and rested it sounds like she is continue to really push herself and stay upright.  I want her to continue to Ace wrap for some mild compression offered to put her in a knee brace but she declined and says she will just continue with the Ace wrap for now.  If it does not continue to get better the swelling to go down then she may need further imaging possible MRI etc.  For now also recommend a trial of  anti-inflammatory.  Right wrist pain-soft tissue injury.  She has good function and strength.  Should continue to improve on its own.  X-ray was negative at the emergency department.  Face contusion just above the left eye-appears to be healing well she still very tender on exam there was also a slight cut over the eyebrow ridge but it seems to be healing.  In regards to wound care she does have several scabs over her right knee wrist and face encouraged her to discontinue Neosporin which should really only be used for a few days and switch to Vaseline for moisturizing.  .  Patient was provided with an additional Ace wrap so that she could wrap the calf as well as the knee.   No orders of the defined types were placed in this encounter.    Beatrice Lecher, MD

## 2018-06-08 ENCOUNTER — Encounter: Payer: Self-pay | Admitting: Family Medicine

## 2018-06-08 ENCOUNTER — Ambulatory Visit (INDEPENDENT_AMBULATORY_CARE_PROVIDER_SITE_OTHER): Payer: Medicare Other | Admitting: Family Medicine

## 2018-06-08 VITALS — Temp 98.6°F | Ht 60.0 in | Wt 134.0 lb

## 2018-06-08 DIAGNOSIS — M25561 Pain in right knee: Secondary | ICD-10-CM

## 2018-06-08 MED ORDER — DICLOFENAC SODIUM 1 % TD GEL: 4.0000 g | Freq: Four times a day (QID) | TRANSDERMAL | 11 refills | Status: DC

## 2018-06-08 NOTE — Progress Notes (Addendum)
Virtual Visit  via Phone Note  I connected with      Alexis Small  by a telemedicine application and verified that I am speaking with the correct person using two identifiers.   I discussed the limitations of evaluation and management by telemedicine and the availability of in person appointments. The patient expressed understanding and agreed to proceed.  History of Present Illness: Alexis Small is a 78 y.o. female who would like to discuss knee pain after fall.  Alexis Small tripped over a dog and fell on [address] suffering a variety of injuries.  She was seen in the emergency department where x-rays were obtained and fortunately did not show any fractures.  She was seen by her primary care provider Dr. Madilyn Fireman on April 3.  In the interim she has had significant symptom improvement and notes that her knee pain has dramatically improved.  She notes a little bit of residual pain confined to the lateral knee and around the fibular head.  She notes that she is treating that with ice home exercise program and rest.  This is significantly improved.  She notes occasionally she will have some pain typically worse at bedtime than during the day.  She is taking Aleve intermittently which has helped quite a bit as well.  She is worried about having to take Aleve and would like to avoid it or reduce her total oral NSAID dose if possible.   She denies any significant locking or catching or giving way.  She think she is improving every day and is very happy with how things are going.     Observations/Objective: Temp 98.6 F (37 C) (Oral)   Ht 5' (1.524 m)   Wt 134 lb (60.8 kg)   BMI 26.17 kg/m  Wt Readings from Last 5 Encounters:  06/08/18 134 lb (60.8 kg)  05/25/18 134 lb (60.8 kg)  04/03/18 133 lb (60.3 kg)  06/12/17 136 lb (61.7 kg)  08/25/16 131 lb (59.4 kg)   Exam: Normal Speech.    Lab and Radiology Results XR Knee Min 3 Views Right3/29/2020 [facility] Result  Impression  IMPRESSION: No acute osseous abnormality.  Electronically Signed by: Virl Cagey  Result Narrative  TECHNIQUE: XR KNEE 3 VIEWS RIGHT  INDICATION: Injury  COMPARISON: None  FINDINGS: No acute fracture or dislocation. Joint spaces are preserved. Soft tissues are unremarkable.      Assessment and Plan: 78 y.o. female with  Right knee pain.  Pain occurred after fall and is significantly improving.  She continues to have some pain in the lateral knee that has not improved yet.  Given COVID-19 pandemic would like to keep patient out of the office if possible.  After discussion with patient will proceed with continued home exercise program, ice rest and potentially compression.  Additionally will prescribe diclofenac gel as that will reduce her need for systemic NSAIDs.  If not improving in 2 to 3 weeks it would be reasonable to have a face-to-face visit in the office to continue to assess her pain however I think she is probably just going to get better.   Meds ordered this encounter  Medications  . diclofenac sodium (VOLTAREN) 1 % GEL    Sig: Apply 4 g topically 4 (four) times daily. To affected joint.    Dispense:  100 g    Refill:  11    Follow Up Instructions:    I discussed the assessment and treatment plan with the patient. The patient  was provided an opportunity to ask questions and all were answered. The patient agreed with the plan and demonstrated an understanding of the instructions.   The patient was advised to call back or seek an in-person evaluation if the symptoms worsen or if the condition fails to improve as anticipated.  I provided 15 minutes of non-face-to-face time (23 mins total time) during this encounter.    Historical information moved to improve visibility of documentation.  Past Medical History:  Diagnosis Date  . Osteopenia 03/05/2008   Qualifier: Diagnosis of  By: Madilyn Fireman MD, Barnetta Chapel    . Shingles    Past Surgical History:   Procedure Laterality Date  . ABDOMINAL HYSTERECTOMY  1970's   Complete, 2x  . APPENDECTOMY  1970's  . BREAST SURGERY  1980's   reduction  . CORNEAL TRANSPLANT  11/2014   [facility]   Social History   Tobacco Use  . Smoking status: Never Smoker  . Smokeless tobacco: Never Used  Substance Use Topics  . Alcohol use: No   family history includes Aneurysm (age of onset: 18) in her mother; COPD in her sister; Coronary artery disease (age of onset: 67) in her sister; Lung cancer in her sister.  Medications: Current Outpatient Medications  Medication Sig Dispense Refill  . AMBULATORY NON FORMULARY MEDICATION Take 2 tablets by mouth at bedtime. Medication Name: [facility] STRENGTH    . Calcium Carbonate-Vitamin D 600-200 MG-UNIT TABS Take 1 tablet by mouth at bedtime.    . Omega 3-6-9 Fatty Acids (OMEGA 3-6-9 COMPLEX PO) Take by mouth.    . diclofenac sodium (VOLTAREN) 1 % GEL Apply 4 g topically 4 (four) times daily. To affected joint. 100 g 11   No current facility-administered medications for this visit.    Allergies  Allergen Reactions  . Oxycodone-Acetaminophen     Other reaction(s): Other (See Comments) "[place]"  . Prednisone Other (See Comments)    Sweats, chest pain, etc  . Sulfamethoxazole     Other reaction(s): Other (See Comments) unknown    Addendum to correct date error due to templating issue

## 2018-06-28 ENCOUNTER — Ambulatory Visit (INDEPENDENT_AMBULATORY_CARE_PROVIDER_SITE_OTHER): Payer: Medicare Other | Admitting: Family Medicine

## 2018-06-28 DIAGNOSIS — M7989 Other specified soft tissue disorders: Secondary | ICD-10-CM | POA: Diagnosis not present

## 2018-06-28 DIAGNOSIS — M25561 Pain in right knee: Secondary | ICD-10-CM

## 2018-06-28 DIAGNOSIS — W101XXA Fall (on)(from) sidewalk curb, initial encounter: Secondary | ICD-10-CM

## 2018-06-28 NOTE — Progress Notes (Signed)
Virtual Visit  I connected with      Alexis Small  by a telemedicine application and verified that I am speaking with the correct person using two identifiers.   I discussed the limitations of evaluation and management by telemedicine and the availability of in person appointments. The patient expressed understanding and agreed to proceed.  History of Present Illness: Alexis Small is a 78 y.o. female who would like to discuss right knee pain.  Patient fell and injured her right knee was seen in the emergency department on March 29.  X-rays at that time were unremarkable.  She followed up with me on April 17.  At that time she had some improvement.  She was tried on diclofenac gel which worked quite well.  She notes overall her pain improved a lot.  However she still has some pain.  She notes continued pain especially at night and she does not take her diclofenac gel.  Additionally she notes persistent knee swelling and discomfort.  She notes that she is had to restrict her activity because of the pain and discomfort.  Additionally she notes that she is developed some calf swelling and more prominent veins in her calf.  This is been ongoing for the past several weeks.  She denies chest pain or shortness of breath however.  She denies any history of DVT.    Observations/Objective: Exam: Normal Speech.  No tachypnea or hoarseness or wheezing.    Assessment and Plan: 78 y.o. female with  Persistent knee pain and new leg swelling.  Obviously concerning for DVT or Baker's cyst or joint effusion.  Plan to evaluate this with ultrasound of the right leg for DVT.  Additionally will obtain standing x-rays of her right knee to evaluate for DJD or radiographically occult injury missed on original x-ray March 29.  We will schedule these radiology appointments before a follow-up appointment with me in the near future.  She will get the x-ray and ultrasound and then present to clinic for in  person assessment and evaluation.  Recheck in the near future.  Precautions reviewed.  Check back sooner if needed.   Orders Placed This Encounter  Procedures  . US Venous Img Lower Unilateral Right    Standing Status:   Future    Standing Expiration Date:   08/28/2019    Order Specific Question:   Reason for Exam (SYMPTOM  OR DIAGNOSIS REQUIRED)    Answer:   eval leg swelling right leg after injury    Order Specific Question:   Preferred imaging location?    Answer:   [facility]  . DG Knee Complete 4 Views Right    Please include patellar sunrise, lateral, and weightbearing bilateral AP and bilateral rosenberg views    Standing Status:   Future    Standing Expiration Date:   08/28/2019    Order Specific Question:   Reason for exam:    Answer:   Please include patellar sunrise, lateral, and weightbearing bilateral AP and bilateral rosenberg views    Comments:   Please include patellar sunrise, lateral, and weightbearing bilateral AP and bilateral rosenberg views    Order Specific Question:   Preferred imaging location?    Answer:   [facility]  . DG Knee 1-2 Views Left    Standing Status:   Future    Standing Expiration Date:   08/29/2019    Order Specific Question:   Reason for Exam (SYMPTOM  OR DIAGNOSIS REQUIRED)  Answer:   For use with right knee x-ray, bilateral AP and Rosenberg standing.    Order Specific Question:   Preferred imaging location?    Answer:   [facility]   No orders of the defined types were placed in this encounter.   Follow Up Instructions:    I discussed the assessment and treatment plan with the patient. The patient was provided an opportunity to ask questions and all were answered. The patient agreed with the plan and demonstrated an understanding of the instructions.   The patient was advised to call back or seek an in-person evaluation if the symptoms worsen or if the condition fails to improve as anticipated.  Time: 25  minutes of intraservice time, with >39 minutes of total time during today's visit.      Historical information moved to improve visibility of documentation.  Past Medical History:  Diagnosis Date  . Osteopenia 03/05/2008   Qualifier: Diagnosis of  By: Madilyn Fireman MD, Barnetta Chapel    . Shingles    Past Surgical History:  Procedure Laterality Date  . ABDOMINAL HYSTERECTOMY  1970's   Complete, 2x  . APPENDECTOMY  1970's  . BREAST SURGERY  1980's   reduction  . CORNEAL TRANSPLANT  11/2014   [facility]   Social History   Tobacco Use  . Smoking status: Never Smoker  . Smokeless tobacco: Never Used  Substance Use Topics  . Alcohol use: No   family history includes Aneurysm (age of onset: 8) in her mother; COPD in her sister; Coronary artery disease (age of onset: 28) in her sister; Lung cancer in her sister.  Medications: Current Outpatient Medications  Medication Sig Dispense Refill  . AMBULATORY NON FORMULARY MEDICATION Take 2 tablets by mouth at bedtime. Medication Name: [facility] STRENGTH    . Calcium Carbonate-Vitamin D 600-200 MG-UNIT TABS Take 1 tablet by mouth at bedtime.    . diclofenac sodium (VOLTAREN) 1 % GEL Apply 4 g topically 4 (four) times daily. To affected joint. 100 g 11  . Omega 3-6-9 Fatty Acids (OMEGA 3-6-9 COMPLEX PO) Take by mouth.     No current facility-administered medications for this visit.    Allergies  Allergen Reactions  . Oxycodone-Acetaminophen     Other reaction(s): Other (See Comments) "Simms"  . Prednisone Other (See Comments)    Sweats, chest pain, etc  . Sulfamethoxazole     Other reaction(s): Other (See Comments) unknown

## 2018-07-02 ENCOUNTER — Ambulatory Visit: Payer: Medicare Other

## 2018-07-02 ENCOUNTER — Ambulatory Visit (INDEPENDENT_AMBULATORY_CARE_PROVIDER_SITE_OTHER): Payer: Medicare Other

## 2018-07-02 ENCOUNTER — Other Ambulatory Visit: Payer: Self-pay

## 2018-07-02 ENCOUNTER — Encounter: Payer: Self-pay | Admitting: Family Medicine

## 2018-07-02 ENCOUNTER — Ambulatory Visit (INDEPENDENT_AMBULATORY_CARE_PROVIDER_SITE_OTHER): Payer: Medicare Other | Admitting: Family Medicine

## 2018-07-02 VITALS — BP 138/80 | HR 70 | Temp 97.6°F | Wt 134.0 lb

## 2018-07-02 DIAGNOSIS — M7989 Other specified soft tissue disorders: Secondary | ICD-10-CM

## 2018-07-02 DIAGNOSIS — M25561 Pain in right knee: Secondary | ICD-10-CM

## 2018-07-02 DIAGNOSIS — W101XXA Fall (on)(from) sidewalk curb, initial encounter: Secondary | ICD-10-CM

## 2018-07-02 DIAGNOSIS — S8992XA Unspecified injury of left lower leg, initial encounter: Secondary | ICD-10-CM | POA: Diagnosis not present

## 2018-07-02 DIAGNOSIS — M899 Disorder of bone, unspecified: Secondary | ICD-10-CM | POA: Diagnosis not present

## 2018-07-02 DIAGNOSIS — M79604 Pain in right leg: Secondary | ICD-10-CM | POA: Diagnosis not present

## 2018-07-02 NOTE — Patient Instructions (Addendum)
Thank you for coming in today.  Use compression stockings.  You can get them at medical supply or a pharmacy.   For the muscles work on straight leg raises .  Work on knee extension and knee flexion with ankle weight.  Do slow squats to about 70deg of knee flexion at home.  Do about 30 reps 1-2x daily.   If not improving we may consider PT again.  If the radiologist sees something concerning we may get advanced imaging.   Keep me updated.  If not improving we can do more.

## 2018-07-02 NOTE — Progress Notes (Signed)
Alexis Small is a 78 y.o. female who presents to [facility] today for follow-up right knee pain.  Patient had virtual visits April and in May for right knee pain.  She had fallen and was seen in the emergency room on May 29.  She was doing better on her last visit May 7 but had some leg swelling and some continued pain.  She was advised to return to clinic for x-ray and ultrasound.  Prior to her visit she had duplex vascular ultrasound of her right leg which fortunately did not show DVT.  Additionally she had x-rays of her right knee. She notes overall she is improved but still having some pain and limited activity.   ROS:  As above  Exam:  BP 138/80    Pulse 70    Temp 97.6 F (36.4 C) (Oral)    Wt 134 lb (60.8 kg)    BMI 26.17 kg/m  Wt Readings from Last 5 Encounters:  07/02/18 134 lb (60.8 kg)  06/08/18 134 lb (60.8 kg)  05/25/18 134 lb (60.8 kg)  04/03/18 133 lb (60.3 kg)  06/12/17 136 lb (61.7 kg)   General: Well Developed, well nourished, and in no acute distress.  Neuro/Psych: Alert and oriented x3, extra-ocular muscles intact, able to move all 4 extremities, sensation grossly intact. Skin: Warm and dry, no rashes noted.  Respiratory: Not using accessory muscles, speaking in full sentences, trachea midline.  Cardiovascular: Pulses palpable, no extremity edema. Abdomen: Does not appear distended. MSK: Right knee relatively normal-appearing without any effusion. Normal range of motion. Not particularly tender. Intact strength.  Right calf: Increased varicosity/spider veins.  No palpable cords.  Not particularly tender.    Lab and Radiology Results No results found for this or any previous visit (from the past 72 hour(s)). Dg Knee 1-2 Views Left  Result Date: 07/02/2018 CLINICAL DATA:  Right knee pain after fall last week. EXAM: LEFT KNEE - 1-2 VIEW COMPARISON:  None. FINDINGS: No evidence of fracture or  dislocation. No evidence of arthropathy or other focal bone abnormality. Soft tissues are unremarkable. IMPRESSION: Negative. Electronically Signed   By: Marijo Conception M.D.   On: 07/02/2018 11:29   US Venous Img Lower Unilateral Right  Result Date: 07/02/2018 CLINICAL DATA:  78 year old female with persistent right lower extremity pain after falling on May 20, 2018. EXAM: RIGHT LOWER EXTREMITY VENOUS DOPPLER ULTRASOUND TECHNIQUE: Gray-scale sonography with graded compression, as well as color Doppler and duplex ultrasound were performed to evaluate the lower extremity deep venous systems from the level of the common femoral vein and including the common femoral, femoral, profunda femoral, popliteal and calf veins including the posterior tibial, peroneal and gastrocnemius veins when visible. The superficial great saphenous vein was also interrogated. Spectral Doppler was utilized to evaluate flow at rest and with distal augmentation maneuvers in the common femoral, femoral and popliteal veins. COMPARISON:  None. FINDINGS: Contralateral Common Femoral Vein: Respiratory phasicity is normal and symmetric with the symptomatic side. No evidence of thrombus. Normal compressibility. Common Femoral Vein: No evidence of thrombus. Normal compressibility, respiratory phasicity and response to augmentation. Saphenofemoral Junction: No evidence of thrombus. Normal compressibility and flow on color Doppler imaging. Profunda Femoral Vein: No evidence of thrombus. Normal compressibility and flow on color Doppler imaging. Femoral Vein: No evidence of thrombus. Normal compressibility, respiratory phasicity and response to augmentation. Popliteal Vein: No evidence of thrombus. Normal compressibility, respiratory phasicity and response to augmentation. Calf Veins: No  evidence of thrombus. Normal compressibility and flow on color Doppler imaging. Superficial Great Saphenous Vein: No evidence of thrombus. Normal compressibility.  Venous Reflux:  None. Other Findings:  None. IMPRESSION: No evidence of deep venous thrombosis. Electronically Signed   By: Jacqulynn Cadet M.D.   On: 07/02/2018 09:41   Dg Knee Complete 4 Views Right  Result Date: 07/02/2018 CLINICAL DATA:  Acute right knee pain after fall last week. EXAM: RIGHT KNEE - COMPLETE 4+ VIEW COMPARISON:  None. FINDINGS: No evidence of fracture, dislocation, or joint effusion. No evidence of arthropathy. Ill-defined sclerotic density is seen in the central portion of the tibial plateau. Soft tissues are unremarkable. IMPRESSION: No fracture or dislocation is noted. Ill-defined sclerotic density is seen in the central portion of the tibial plateau which may represent bone infarction. However, neoplasm cannot be excluded and further evaluation with MRI is recommended on nonemergent basis. Electronically Signed   By: Marijo Conception M.D.   On: 07/02/2018 11:34   I personally (independently) visualized and performed the interpretation of the images attached in this note.     Assessment and Plan: 78 y.o. female with right knee pain following injury.  No acute fracture seen on x-ray today.  However I agree with radiology about sclerotic density in the tibial plateau.  Certainly this could be a sequelae of injury.  Nonemergent MRI is reasonable as this would help characterize her knee pain further and evaluate the sclerotic density.  I contacted the patient and radiologist and we discussed options.  Plan to wait until June and reassess at that point.  Likely will be ordering MRI then but would like to let COVID-19 calm down a bit before proceeding with further advanced imaging.  In the meantime we will proceed with home exercise program working on quad and hamstring strengthening.   As for the varicosities fortunately ultrasound was negative for DVT.  Plan for compression stockings.  I spent 25 minutes with this patient, greater than 50% was face-to-face time counseling  regarding differential diagnosis treatment plan options x-ray findings and ultrasound findings.    Historical information moved to improve visibility of documentation.  Past Medical History:  Diagnosis Date   Osteopenia 03/05/2008   Qualifier: Diagnosis of  By: Madilyn Fireman MD, Lidia Collum    Past Surgical History:  Procedure Laterality Date   ABDOMINAL HYSTERECTOMY  1970's   Complete, 2x   APPENDECTOMY  1970's   BREAST SURGERY  1980's   reduction   CORNEAL TRANSPLANT  11/2014   [facility]   Social History   Tobacco Use   Smoking status: Never Smoker   Smokeless tobacco: Never Used  Substance Use Topics   Alcohol use: No   family history includes Aneurysm (age of onset: 81) in her mother; COPD in her sister; Coronary artery disease (age of onset: 24) in her sister; Lung cancer in her sister.  Medications: Current Outpatient Medications  Medication Sig Dispense Refill   Calcium Carbonate-Vitamin D 600-200 MG-UNIT TABS Take 1 tablet by mouth at bedtime.     Omega 3-6-9 Fatty Acids (OMEGA 3-6-9 COMPLEX PO) Take by mouth.     AMBULATORY NON FORMULARY MEDICATION Take 2 tablets by mouth at bedtime. Medication Name: [facility] STRENGTH     diclofenac sodium (VOLTAREN) 1 % GEL Apply 4 g topically 4 (four) times daily. To affected joint. 100 g 11   No current facility-administered medications for this visit.    Allergies  Allergen Reactions  Oxycodone-Acetaminophen     Other reaction(s): Other (See Comments) "FELT REAL WEIRD"   Prednisone Other (See Comments)    Sweats, chest pain, etc   Sulfamethoxazole     Other reaction(s): Other (See Comments) unknown      Discussed warning signs or symptoms. Please see discharge instructions. Patient expresses understanding.

## 2018-08-02 ENCOUNTER — Ambulatory Visit (INDEPENDENT_AMBULATORY_CARE_PROVIDER_SITE_OTHER): Payer: Medicare Other | Admitting: Family Medicine

## 2018-08-02 ENCOUNTER — Telehealth: Payer: Self-pay | Admitting: Family Medicine

## 2018-08-02 VITALS — Wt 134.0 lb

## 2018-08-02 DIAGNOSIS — M899 Disorder of bone, unspecified: Secondary | ICD-10-CM

## 2018-08-02 NOTE — Telephone Encounter (Signed)
Called and LM via VM to return clinic call. Number provided. KG LPN

## 2018-08-02 NOTE — Telephone Encounter (Signed)
I spoke with the radiologist who specializes in musculoskeletal imaging and he thinks it is super unlikely to be cancer.  He thinks it may have been a little crack in the bone.  Because you have improved quite a bit and it is very unlikely that this represents anything dangerous I think it is reasonable to recheck the x-ray in the next few days and that can help [place] determine if we need to do anything like an MRI.  So I have ordered an x-ray.  The plan is to go to the imaging department and get an x-ray and will contact you with the results.

## 2018-08-02 NOTE — Progress Notes (Signed)
Virtual Visit  I connected with      Alexis Small  by a telemedicine application and verified that I am speaking with the correct person using two identifiers.   I discussed the limitations of evaluation and management by telemedicine and the availability of in person appointments. The patient expressed understanding and agreed to proceed.  History of Present Illness: Alexis Small is a 78 y.o. female who would like to discuss knee pain.  Patient tripped and fell injuring her right knee.  She had evaluation with x-ray on May 11.  At that time she had a sclerotic lesion on x-ray and was still having some pain.  In the interim she notes that her pain is completely resolved.  She is feeling much better and happy with how things are going.  Original x-ray read in May suggested bone infarct or possible neoplasm and recommended MRI.  After discussion Alexis Small and I decided for a bit of watchful waiting with reassessment in June.   Observations/Objective: Wt 134 lb (60.8 kg)   BMI 26.17 kg/m  Wt Readings from Last 5 Encounters:  08/02/18 134 lb (60.8 kg)  07/02/18 134 lb (60.8 kg)  06/08/18 134 lb (60.8 kg)  05/25/18 134 lb (60.8 kg)  04/03/18 133 lb (60.3 kg)   Exam: Normal Speech.  Nontoxic no acute distress   Lab and Radiology Results Dg Knee 1-2 Views Left  Result Date: 07/02/2018 CLINICAL DATA:  Right knee pain after fall last week. EXAM: LEFT KNEE - 1-2 VIEW COMPARISON:  None. FINDINGS: No evidence of fracture or dislocation. No evidence of arthropathy or other focal bone abnormality. Soft tissues are unremarkable. IMPRESSION: Negative. Electronically Signed   By: Marijo Conception M.D.   On: 07/02/2018 11:29   US Venous Img Lower Unilateral Right  Result Date: 07/02/2018 CLINICAL DATA:  78 year old female with persistent right lower extremity pain after falling on May 20, 2018. EXAM: RIGHT LOWER EXTREMITY VENOUS DOPPLER ULTRASOUND TECHNIQUE: Gray-scale sonography  with graded compression, as well as color Doppler and duplex ultrasound were performed to evaluate the lower extremity deep venous systems from the level of the common femoral vein and including the common femoral, femoral, profunda femoral, popliteal and calf veins including the posterior tibial, peroneal and gastrocnemius veins when visible. The superficial great saphenous vein was also interrogated. Spectral Doppler was utilized to evaluate flow at rest and with distal augmentation maneuvers in the common femoral, femoral and popliteal veins. COMPARISON:  None. FINDINGS: Contralateral Common Femoral Vein: Respiratory phasicity is normal and symmetric with the symptomatic side. No evidence of thrombus. Normal compressibility. Common Femoral Vein: No evidence of thrombus. Normal compressibility, respiratory phasicity and response to augmentation. Saphenofemoral Junction: No evidence of thrombus. Normal compressibility and flow on color Doppler imaging. Profunda Femoral Vein: No evidence of thrombus. Normal compressibility and flow on color Doppler imaging. Femoral Vein: No evidence of thrombus. Normal compressibility, respiratory phasicity and response to augmentation. Popliteal Vein: No evidence of thrombus. Normal compressibility, respiratory phasicity and response to augmentation. Calf Veins: No evidence of thrombus. Normal compressibility and flow on color Doppler imaging. Superficial Great Saphenous Vein: No evidence of thrombus. Normal compressibility. Venous Reflux:  None. Other Findings:  None. IMPRESSION: No evidence of deep venous thrombosis. Electronically Signed   By: Jacqulynn Cadet M.D.   On: 07/02/2018 09:41   Dg Knee Complete 4 Views Right  Result Date: 07/02/2018 CLINICAL DATA:  Acute right knee pain after fall last week. EXAM: RIGHT KNEE -  COMPLETE 4+ VIEW COMPARISON:  None. FINDINGS: No evidence of fracture, dislocation, or joint effusion. No evidence of arthropathy. Ill-defined sclerotic  density is seen in the central portion of the tibial plateau. Soft tissues are unremarkable. IMPRESSION: No fracture or dislocation is noted. Ill-defined sclerotic density is seen in the central portion of the tibial plateau which may represent bone infarction. However, neoplasm cannot be excluded and further evaluation with MRI is recommended on nonemergent basis. Electronically Signed   By: Marijo Conception M.D.   On: 07/02/2018 11:34   I personally (independently) visualized and performed the interpretation of the images attached in this note.   Assessment and Plan: 78 y.o. female with sclerotic lesion on right knee.  Clinically doing extremely well.  I discussed the case with radiology again.  This lesion on x-ray could be a healed fracture and is extremely unlikely to represent any neoplasm.  Plan to instead of obtaining MRI now we will repeat x-ray to reassess.  If looking more reassuring will hold off on MRI proceed with watchful waiting.  PDMP not reviewed this encounter. No orders of the defined types were placed in this encounter.  No orders of the defined types were placed in this encounter.   Follow Up Instructions:    I discussed the assessment and treatment plan with the patient. The patient was provided an opportunity to ask questions and all were answered. The patient agreed with the plan and demonstrated an understanding of the instructions.   The patient was advised to call back or seek an in-person evaluation if the symptoms worsen or if the condition fails to improve as anticipated.  Time: 15 minutes of intraservice time, with >22 minutes of total time during today's visit.      Historical information moved to improve visibility of documentation.  Past Medical History:  Diagnosis Date  . Osteopenia 03/05/2008   Qualifier: Diagnosis of  By: Madilyn Fireman MD, Barnetta Chapel    . Shingles    Past Surgical History:  Procedure Laterality Date  . ABDOMINAL HYSTERECTOMY  1970's    Complete, 2x  . APPENDECTOMY  1970's  . BREAST SURGERY  1980's   reduction  . CORNEAL TRANSPLANT  11/2014   [facility]   Social History   Tobacco Use  . Smoking status: Never Smoker  . Smokeless tobacco: Never Used  Substance Use Topics  . Alcohol use: No   family history includes Aneurysm (age of onset: 49) in her mother; COPD in her sister; Coronary artery disease (age of onset: 21) in her sister; Lung cancer in her sister.  Medications: Current Outpatient Medications  Medication Sig Dispense Refill  . AMBULATORY NON FORMULARY MEDICATION Take 2 tablets by mouth at bedtime. Medication Name: [facility] STRENGTH    . Calcium Carbonate-Vitamin D 600-200 MG-UNIT TABS Take 1 tablet by mouth at bedtime.    . diclofenac sodium (VOLTAREN) 1 % GEL Apply 4 g topically 4 (four) times daily. To affected joint. 100 g 11  . Omega 3-6-9 Fatty Acids (OMEGA 3-6-9 COMPLEX PO) Take by mouth.     No current facility-administered medications for this visit.    Allergies  Allergen Reactions  . Oxycodone-Acetaminophen     Other reaction(s): Other (See Comments) "Brandon"  . Prednisone Other (See Comments)    Sweats, chest pain, etc  . Sulfamethoxazole     Other reaction(s): Other (See Comments) unknown

## 2018-08-02 NOTE — Telephone Encounter (Signed)
Patient advised of recommendations.  

## 2018-08-07 ENCOUNTER — Other Ambulatory Visit: Payer: Self-pay

## 2018-08-07 ENCOUNTER — Ambulatory Visit (INDEPENDENT_AMBULATORY_CARE_PROVIDER_SITE_OTHER): Payer: Medicare Other

## 2018-08-07 DIAGNOSIS — M899 Disorder of bone, unspecified: Secondary | ICD-10-CM

## 2018-08-07 DIAGNOSIS — S8992XA Unspecified injury of left lower leg, initial encounter: Secondary | ICD-10-CM | POA: Diagnosis not present

## 2018-08-07 DIAGNOSIS — M1711 Unilateral primary osteoarthritis, right knee: Secondary | ICD-10-CM | POA: Diagnosis not present

## 2018-11-07 DIAGNOSIS — Z85828 Personal history of other malignant neoplasm of skin: Secondary | ICD-10-CM | POA: Diagnosis not present

## 2018-11-07 DIAGNOSIS — L814 Other melanin hyperpigmentation: Secondary | ICD-10-CM | POA: Diagnosis not present

## 2018-11-07 DIAGNOSIS — D225 Melanocytic nevi of trunk: Secondary | ICD-10-CM | POA: Diagnosis not present

## 2018-11-07 DIAGNOSIS — L821 Other seborrheic keratosis: Secondary | ICD-10-CM | POA: Diagnosis not present

## 2018-11-07 DIAGNOSIS — D1801 Hemangioma of skin and subcutaneous tissue: Secondary | ICD-10-CM | POA: Diagnosis not present

## 2019-01-21 ENCOUNTER — Encounter: Payer: Self-pay | Admitting: Family Medicine

## 2019-01-21 ENCOUNTER — Ambulatory Visit (INDEPENDENT_AMBULATORY_CARE_PROVIDER_SITE_OTHER): Payer: Medicare Other

## 2019-01-21 ENCOUNTER — Ambulatory Visit (INDEPENDENT_AMBULATORY_CARE_PROVIDER_SITE_OTHER): Payer: Medicare Other | Admitting: Family Medicine

## 2019-01-21 ENCOUNTER — Other Ambulatory Visit: Payer: Self-pay

## 2019-01-21 VITALS — BP 128/78 | HR 87 | Ht 60.0 in | Wt 141.0 lb

## 2019-01-21 DIAGNOSIS — M79672 Pain in left foot: Secondary | ICD-10-CM

## 2019-01-21 DIAGNOSIS — M19072 Primary osteoarthritis, left ankle and foot: Secondary | ICD-10-CM | POA: Diagnosis not present

## 2019-01-21 NOTE — Patient Instructions (Signed)
Ok to ice the area as needed.  We will call once the x-ray results are available.

## 2019-01-21 NOTE — Progress Notes (Signed)
Established Patient Office Visit  Subjective:  Patient ID: Alexis Small, female    DOB: 11-26-1940  Age: 78 y.o. MRN: [id number]  CC:  Chief Complaint  Patient presents with  . Foot Pain    L lateral foot pain x 1 month. she also reports that before leaving the house her great grand son stepped on the area that is in pain    HPI Alexis Small presents for left foot pain that she has had for approximately 1 month.  She says about 3 months ago she bought a special pair of shoes to help with her bunion that is also on her left foot but then started noticing about a month ago that she was having pain on the outside of her foot.  She says it is a sharp shooting burning pain when it happens.  It does seem to come and go.  It is worse if she has been walking a lot and sometimes it will even wake her up at night.  She has not been doing any specific treatments for it but did stop wearing the new running shoes about a week ago just to see if that would help.  She also reports before leaving her house today that her great grandson stepped on that area and so it is in more pain than usual today.  He denies any prior history of gout.  Past Medical History:  Diagnosis Date  . Osteopenia 03/05/2008   Qualifier: Diagnosis of  By: Madilyn Fireman MD, Barnetta Chapel    . Shingles     Past Surgical History:  Procedure Laterality Date  . ABDOMINAL HYSTERECTOMY  1970's   Complete, 2x  . APPENDECTOMY  1970's  . BREAST SURGERY  1980's   reduction  . CORNEAL TRANSPLANT  11/2014   [facility]    Family History  Problem Relation Age of Onset  . Aneurysm Mother 41       cerebral  . Coronary artery disease Sister 88       deceased age 49  . COPD Sister        smoker  . Lung cancer Sister   . Birth defects Neg Hx     Social History   Socioeconomic History  . Marital status: Widowed    Spouse name: Not on file  . Number of children: 3  . Years of education: Not on file  . Highest  education level: Not on file  Occupational History    Employer: [employer]  Social Needs  . Financial resource strain: Not on file  . Food insecurity    Worry: Not on file    Inability: Not on file  . Transportation needs    Medical: Not on file    Non-medical: Not on file  Tobacco Use  . Smoking status: Never Smoker  . Smokeless tobacco: Never Used  Substance and Sexual Activity  . Alcohol use: No  . Drug use: No  . 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  Lifestyle  . Physical activity    Days per week: Not on file    Minutes per session: Not on file  . Stress: Not on file  Relationships  . Social Herbalist on phone: Not on file    Gets together: Not on file    Attends religious service: Not on file    Active member of club or organization: Not on  file    Attends meetings of clubs or organizations: Not on file    Relationship status: Not on file  . Intimate partner violence    Fear of current or ex partner: Not on file    Emotionally abused: Not on file    Physically abused: Not on file    Forced sexual activity: Not on file  Other Topics Concern  . Not on file  Social History Narrative   Walking for exercise.  No daily caffeine.     Outpatient Medications Prior to Visit  Medication Sig Dispense Refill  . AMBULATORY NON FORMULARY MEDICATION Take 2 tablets by mouth at bedtime. Medication Name: [facility] STRENGTH    . AMBULATORY NON FORMULARY MEDICATION Take 1 tablet by mouth daily. Medication Name: Probiotic    . Ascorbic Acid (VITAMIN C PO) Take 1 tablet by mouth daily.    . Calcium Carbonate-Vitamin D 600-200 MG-UNIT TABS Take 1 tablet by mouth at bedtime.    . Multiple Vitamins-Minerals (ZINC PO) Take 1 tablet by mouth daily.    . Omega 3-6-9 Fatty Acids (OMEGA 3-6-9 COMPLEX PO) Take by mouth.    . diclofenac sodium (VOLTAREN) 1 % GEL Apply 4 g topically 4 (four) times  daily. To affected joint. 100 g 11   No facility-administered medications prior to visit.     Allergies  Allergen Reactions  . Oxycodone-Acetaminophen     Other reaction(s): Other (See Comments) "[place]"  . Prednisone Other (See Comments)    Sweats, chest pain, etc  . Sulfamethoxazole     Other reaction(s): Other (See Comments) unknown    ROS Review of Systems    Objective:    Physical Exam  BP 128/78   Pulse 87   Ht 5' (1.524 m)   Wt 141 lb (64 kg)   SpO2 100%   BMI 27.54 kg/m  Wt Readings from Last 3 Encounters:  01/21/19 141 lb (64 kg)  08/02/18 134 lb (60.8 kg)  07/02/18 134 lb (60.8 kg)     There are no preventive care reminders to display for this patient.  There are no preventive care reminders to display for this patient.  Lab Results  Component Value Date   TSH 1.40 06/12/2017   Lab Results  Component Value Date   WBC 5.3 04/19/2018   HGB 13.1 04/19/2018   HCT 39.0 04/19/2018   MCV 88.2 04/19/2018   PLT 210 04/19/2018   Lab Results  Component Value Date   NA 139 04/19/2018   K 4.1 04/19/2018   CO2 27 04/19/2018   GLUCOSE 83 04/19/2018   BUN 16 04/19/2018   CREATININE 0.90 04/19/2018   BILITOT 0.5 04/19/2018   ALKPHOS 71 10/30/2015   AST 15 04/19/2018   ALT 9 04/19/2018   PROT 6.9 04/19/2018   ALBUMIN 4.3 10/30/2015   CALCIUM 9.3 04/19/2018   Lab Results  Component Value Date   CHOL 166 04/19/2018   Lab Results  Component Value Date   HDL 82 04/19/2018   Lab Results  Component Value Date   LDLCALC 67 04/19/2018   Lab Results  Component Value Date   TRIG 90 04/19/2018   Lab Results  Component Value Date   CHOLHDL 2.0 04/19/2018   No results found for: HGBA1C    Assessment & Plan:   Problem List Items Addressed This Visit      Other   Left foot pain - Primary   Relevant Orders   DG Foot Complete Left (  Completed)      Left lateral foot pain over the proximal end of the fifth metatarsal.  Consider  stress fracture versus pain from the cuboid bone versus peroneal tendinitis.  We will start with plain film x-ray since her pain has been persistent for a month.  Will call with results once available.  In the meantime recommend icing the area as needed.  No orders of the defined types were placed in this encounter.   Follow-up: Return if symptoms worsen or fail to improve.    Beatrice Lecher, MD

## 2019-01-25 NOTE — Addendum Note (Signed)
Addended by: Dessie Coma on: 01/25/2019 10:14 AM   Modules accepted: Orders

## 2019-01-27 IMAGING — DX DG FOOT COMPLETE 3+V*L*
3 series · 3 of 3 positions shown · non-contrast
Comparison: Left foot series dated September 10, 2015

CLINICAL DATA: Twisting injury of the left ankle and foot 5 days
ago. Generalized foot pain radiating into the ankle.

EXAM:
LEFT FOOT - COMPLETE 3+ VIEW

[foot ap]
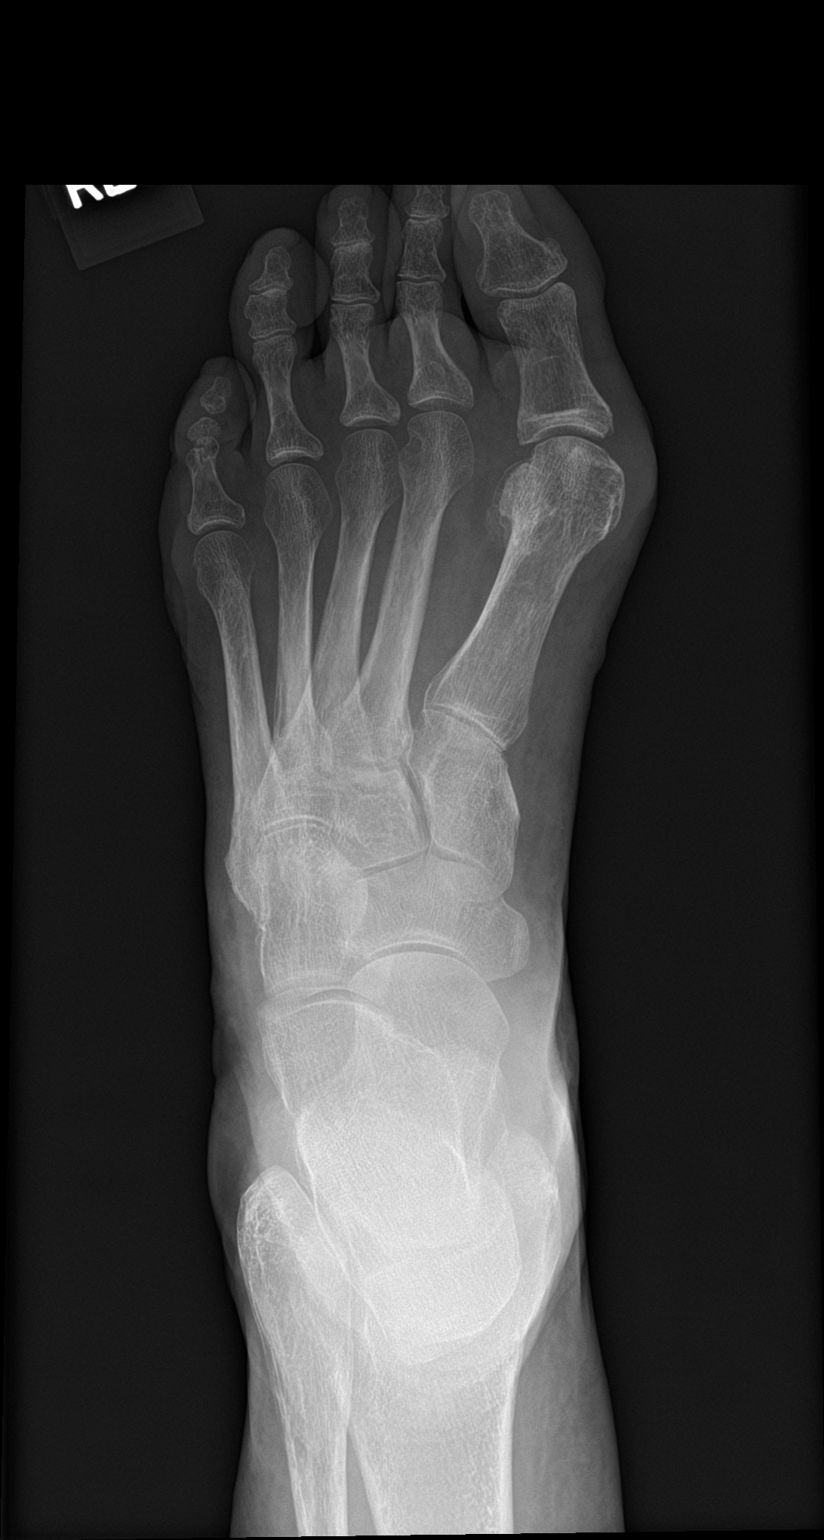

[foot obl]
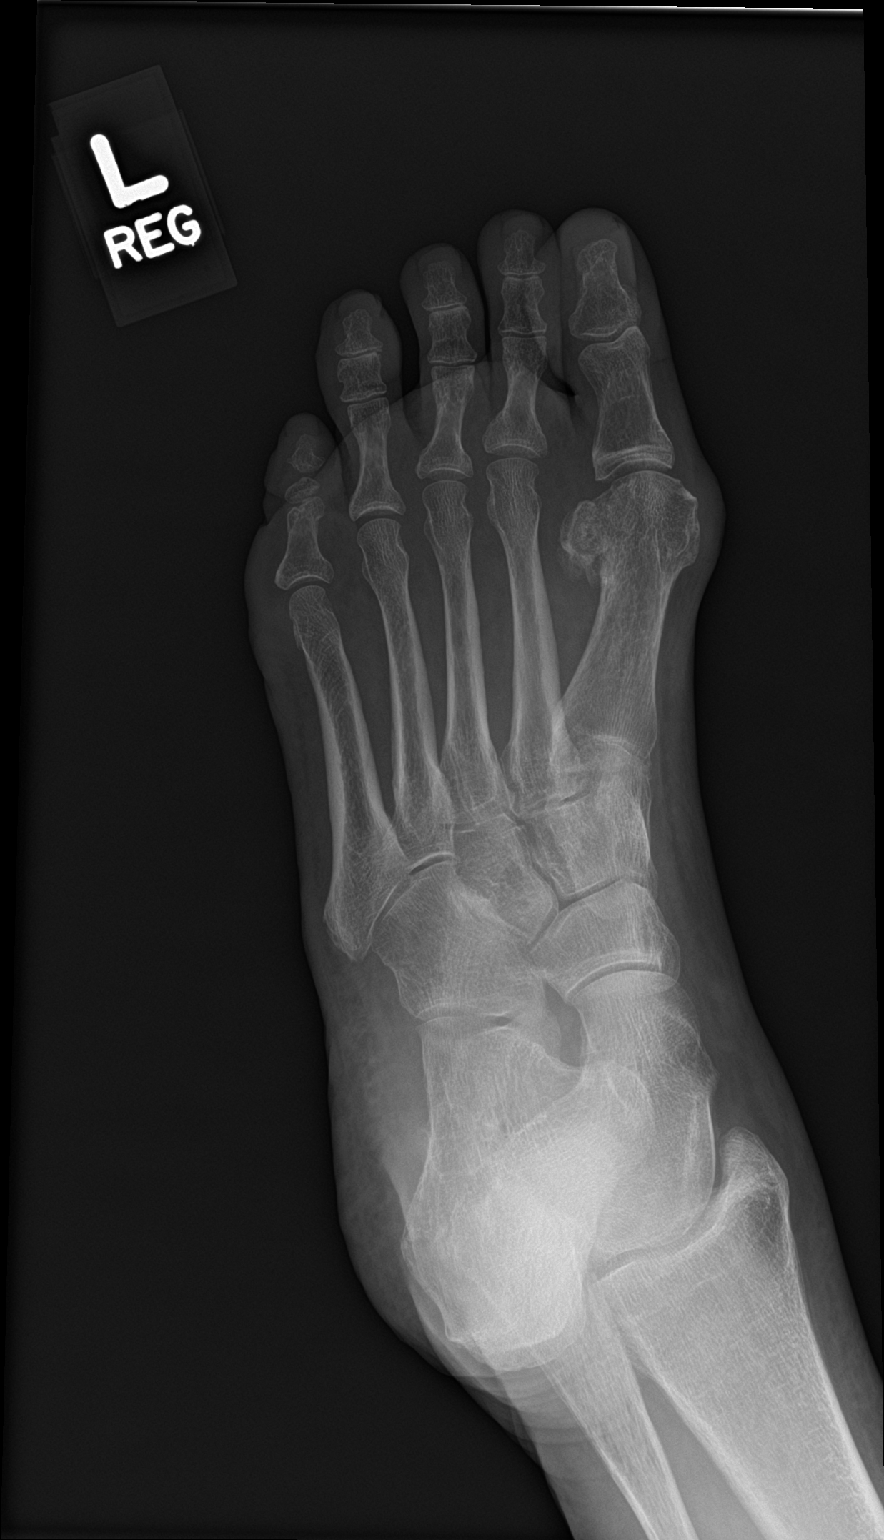

[foot lat]
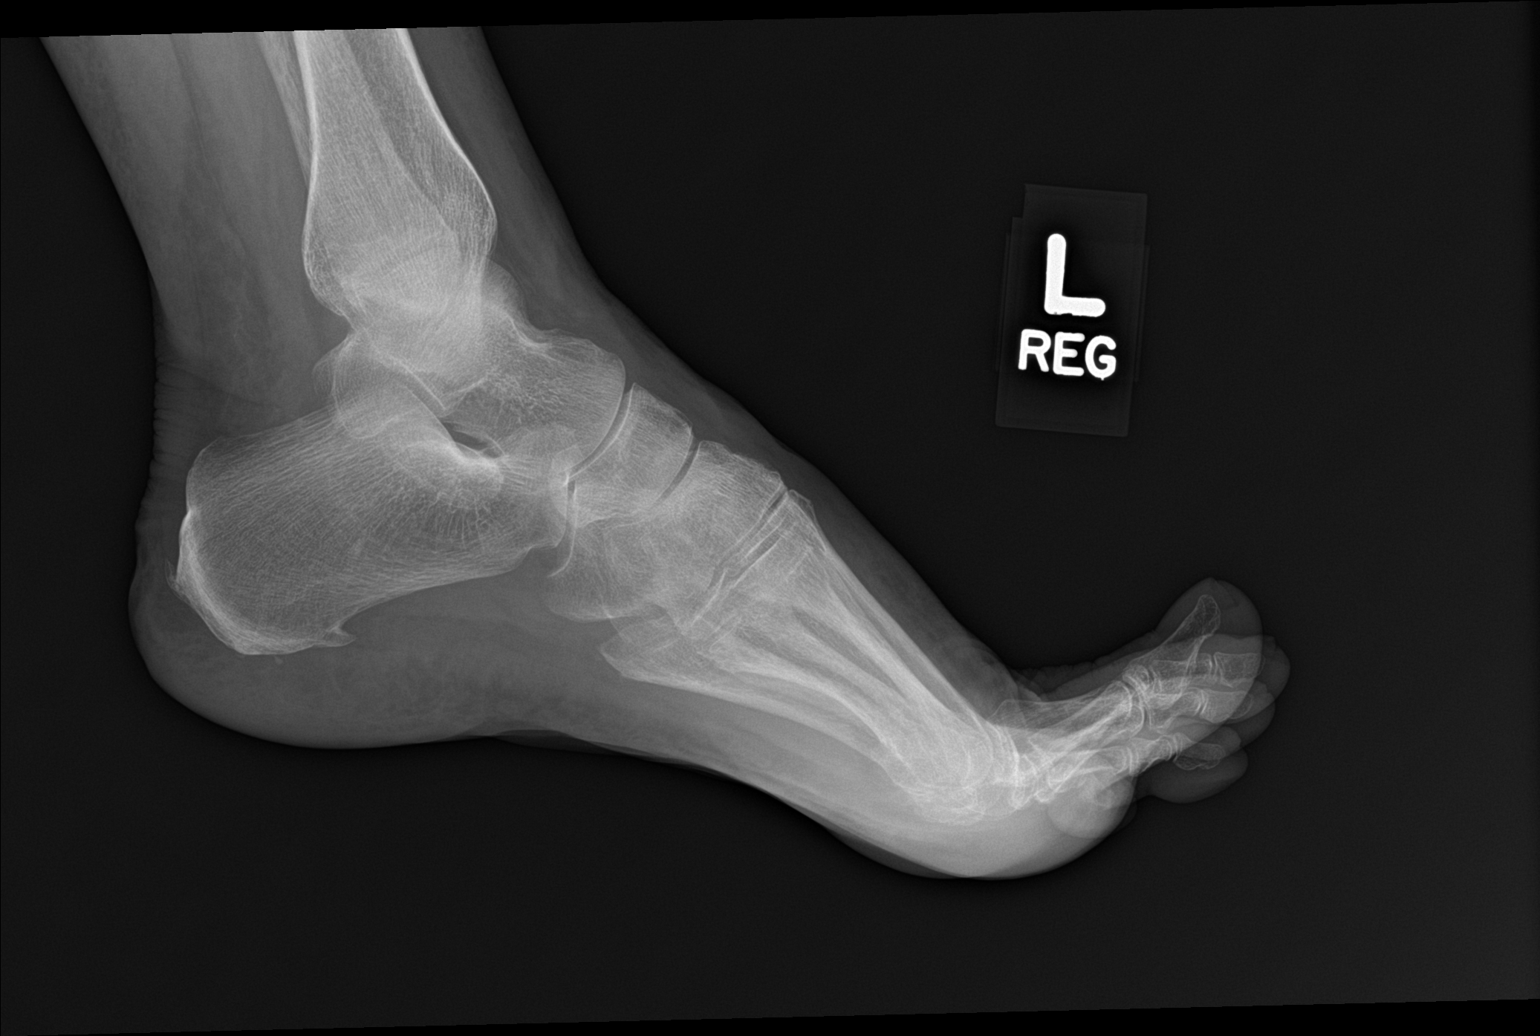

[3 of 3 positions shown; findings below may reference images not displayed]

FINDINGS: The bones are subjectively osteopenic. There is mild chronic
widening of the DIP joint of the third toe. The other IP joints
exhibit mild narrowing but are stable. The MTP joint spaces are
reasonably well-maintained. There is a mild hallux valgus contour of
the first ray which is stable. The metatarsals and tarsals appear
intact. There are plantar and Achilles region calcaneal spurs. The
soft tissues exhibit no significant abnormalities.
IMPRESSION: There is no acute or significant chronic bony abnormality of the
left foot.

## 2019-01-29 ENCOUNTER — Other Ambulatory Visit: Payer: Self-pay

## 2019-01-29 ENCOUNTER — Ambulatory Visit: Payer: Self-pay

## 2019-01-29 ENCOUNTER — Ambulatory Visit (INDEPENDENT_AMBULATORY_CARE_PROVIDER_SITE_OTHER): Payer: Medicare Other | Admitting: Family Medicine

## 2019-01-29 ENCOUNTER — Encounter: Payer: Self-pay | Admitting: Family Medicine

## 2019-01-29 VITALS — BP 138/81 | HR 80 | Ht 60.0 in | Wt 141.0 lb

## 2019-01-29 DIAGNOSIS — M79672 Pain in left foot: Secondary | ICD-10-CM

## 2019-01-29 NOTE — Assessment & Plan Note (Signed)
Pain is most intense at the base of the fifth with redness and swelling of this area.  Increased Doppler activity on ultrasound.  Does not appear to be a stress fracture.  Could be suggestive of an inflammatory change or gout.  Feel like we need to calm this down before making orthotics. -Pennsaid samples. -Low postop shoe. -Counseled on supportive care. -Could consider custom orthotics going forward to help with stabilization.

## 2019-01-29 NOTE — Progress Notes (Signed)
Medication Samples have been provided to the patient.  Drug name: Pennsaid       Strength: 2%        Qty: 2 Boxes  [id number]  Exp.Date: 12/2019  Dosing instructions: Use a peasize amount and rub gently.  The patient has been instructed regarding the correct time, dose, and frequency of taking this medication, including desired effects and most common side effects.   Sherrie George, [place] 4:01 PM 01/29/2019

## 2019-01-29 NOTE — Progress Notes (Signed)
Alexis Small - 78 y.o. female MRN [id number]  Date of birth: [birth year]  SUBJECTIVE:  Including CC & ROS.  Chief Complaint  Patient presents with  . Foot Orthotics    Alexis Small is a 78 y.o. female that is presenting with left foot pain.  The pain is been ongoing for several months now.  She denies any specific inciting event.  Most of her pain is at the base of the fifth metatarsal.  She has not had any improvement with modalities to date.  Pain seems to be localized at this area.  Feels like the pain can be severe.  Has not had any history of gout.  Pain is sharp and stabbing.  It is worse with ambulation..  Independent review of the left foot x-ray from 11/30 shows no acute abnormality.   Review of Systems  Constitutional: Negative for fever.  HENT: Negative for congestion.   Respiratory: Negative for cough.   Cardiovascular: Negative for chest pain.  Gastrointestinal: Negative for abdominal distention.  Musculoskeletal: Positive for arthralgias and gait problem.  Skin: Negative for color change.  Neurological: Negative for weakness.  Hematological: Negative for adenopathy.    HISTORY: Past Medical, Surgical, Social, and Family History Reviewed & Updated per EMR.   Pertinent Historical Findings include:  Past Medical History:  Diagnosis Date  . Osteopenia 03/05/2008   Qualifier: Diagnosis of  By: Madilyn Fireman MD, Barnetta Chapel    . Shingles     Past Surgical History:  Procedure Laterality Date  . ABDOMINAL HYSTERECTOMY  1970's   Complete, 2x  . APPENDECTOMY  1970's  . BREAST SURGERY  1980's   reduction  . CORNEAL TRANSPLANT  11/2014   [facility]    Allergies  Allergen Reactions  . Oxycodone-Acetaminophen     Other reaction(s): Other (See Comments) "[place]"  . Prednisone Other (See Comments)    Sweats, chest pain, etc  . Sulfamethoxazole     Other reaction(s): Other (See Comments) unknown    Family History  Problem Relation Age of  Onset  . Aneurysm Mother 44       cerebral  . Coronary artery disease Sister 93       deceased age 70  . COPD Sister        smoker  . Lung cancer Sister   . Birth defects Neg Hx      Social History   Socioeconomic History  . Marital status: Widowed    Spouse name: Not on file  . Number of children: 3  . Years of education: Not on file  . Highest education level: Not on file  Occupational History    Employer: [employer]  Social Needs  . Financial resource strain: Not on file  . Food insecurity    Worry: Not on file    Inability: Not on file  . Transportation needs    Medical: Not on file    Non-medical: Not on file  Tobacco Use  . Smoking status: Never Smoker  . Smokeless tobacco: Never Used  Substance and Sexual Activity  . Alcohol use: No  . Drug use: No  . 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  Lifestyle  . Physical activity    Days per week: Not on file    Minutes per session: Not on file  . Stress: Not on file  Relationships  . Social Herbalist on phone:  Not on file    Gets together: Not on file    Attends religious service: Not on file    Active member of club or organization: Not on file    Attends meetings of clubs or organizations: Not on file    Relationship status: Not on file  . Intimate partner violence    Fear of current or ex partner: Not on file    Emotionally abused: Not on file    Physically abused: Not on file    Forced sexual activity: Not on file  Other Topics Concern  . Not on file  Social History Narrative   Walking for exercise.  No daily caffeine.      PHYSICAL EXAM:  VS: BP 138/81   Pulse 80   Ht 5' (1.524 m)   Wt 141 lb (64 kg)   BMI 27.54 kg/m  Physical Exam Gen: NAD, alert, cooperative with exam, well-appearing ENT: normal lips, normal nasal mucosa,  Eye: normal EOM, normal conjunctiva and lids CV:  no edema, +2 pedal pulses    Resp: no accessory muscle use, non-labored,  Skin: no rashes, no areas of induration  Neuro: normal tone, normal sensation to touch Psych:  normal insight, alert and oriented MSK:  Left foot: Redness and tenderness at the base of the fifth. Normal range of motion. Normal strength resistance. Hallux valgus most severe on the left.  Bunionette's formed bilaterally. Hammertoe in present. Loss of the transverse arch. Fairly well-maintained longitudinal arch. Neurovascularly intact  Limited ultrasound: Left foot:  Normal insertion of the peroneal brevis into the base of the fifth. There appears to be no fracture at the base of the fifth. There is significant hyperemia around this area with mild effusion noted.  Summary: Findings suggestive of inflammatory change at the base of the fifth.  Less likely for stress fracture.  Possible gout or inflammatory reaction.  Ultrasound and interpretation by Clearance Coots, MD    ASSESSMENT & PLAN:   Left foot pain Pain is most intense at the base of the fifth with redness and swelling of this area.  Increased Doppler activity on ultrasound.  Does not appear to be a stress fracture.  Could be suggestive of an inflammatory change or gout.  Feel like we need to calm this down before making orthotics. -Pennsaid samples. -Low postop shoe. -Counseled on supportive care. -Could consider custom orthotics going forward to help with stabilization.

## 2019-01-29 NOTE — Patient Instructions (Signed)
Nice to meet you Please try the post op shoe and avoid going barefoot  Please try rubbing the pennsaid on the area  Please continue ice  Please bring your shoes that you wear the most when we make the orthotics.  Please send me a message in MyChart with any questions or updates.  Please see me back in 2-3 weeks.   --Dr. Raeford Razor

## 2019-03-06 ENCOUNTER — Ambulatory Visit (INDEPENDENT_AMBULATORY_CARE_PROVIDER_SITE_OTHER): Payer: Medicare Other | Admitting: Family Medicine

## 2019-03-06 ENCOUNTER — Other Ambulatory Visit: Payer: Self-pay

## 2019-03-06 ENCOUNTER — Ambulatory Visit: Payer: Self-pay

## 2019-03-06 ENCOUNTER — Encounter: Payer: Self-pay | Admitting: Family Medicine

## 2019-03-06 VITALS — BP 113/73 | HR 83 | Ht 60.0 in | Wt 140.0 lb

## 2019-03-06 DIAGNOSIS — M79672 Pain in left foot: Secondary | ICD-10-CM

## 2019-03-06 NOTE — Progress Notes (Signed)
Alexis Small - 79 y.o. female MRN [id number]  Date of birth: 05-19-1940  SUBJECTIVE:  Including CC & ROS.  Chief Complaint  Patient presents with  . Follow-up    follow up for left foot    Alexis Small is a 79 y.o. female that is presenting with acute left great toe pain after an injury sustained 3 days after her initial appointment.  Her pain at the base of the fifth metatarsal has improved with the medication as well as the postop shoe.  She did sustain an injury were it occurred over the distal phalanx of the great toe.  She is having ongoing pain over this area.  She has had improvement of the redness but ecchymosis is still present.  She has been using the postop shoe during this time.  Denies any numbness or tingling.   Review of Systems See HPI   HISTORY: Past Medical, Surgical, Social, and Family History Reviewed & Updated per EMR.   Pertinent Historical Findings include:  Past Medical History:  Diagnosis Date  . Osteopenia 03/05/2008   Qualifier: Diagnosis of  By: Madilyn Fireman MD, Barnetta Chapel    . Shingles     Past Surgical History:  Procedure Laterality Date  . ABDOMINAL HYSTERECTOMY  1970's   Complete, 2x  . APPENDECTOMY  1970's  . BREAST SURGERY  1980's   reduction  . CORNEAL TRANSPLANT  11/2014   [facility]    Allergies  Allergen Reactions  . Oxycodone-Acetaminophen     Other reaction(s): Other (See Comments) "[place]"  . Prednisone Other (See Comments)    Sweats, chest pain, etc  . Sulfamethoxazole     Other reaction(s): Other (See Comments) unknown    Family History  Problem Relation Age of Onset  . Aneurysm Mother 39       cerebral  . Coronary artery disease Sister 36       deceased age 40  . COPD Sister        smoker  . Lung cancer Sister   . Birth defects Neg Hx      Social History   Socioeconomic History  . Marital status: Widowed    Spouse name: Not on file  . Number of children: 3  . Years of education: Not on  file  . Highest education level: Not on file  Occupational History    Employer: [employer]  Tobacco Use  . Smoking status: Never Smoker  . Smokeless tobacco: Never Used  Substance and Sexual Activity  . Alcohol use: No  . Drug use: No  . 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  Other Topics Concern  . Not on file  Social History Narrative   Walking for exercise.  No daily caffeine.    Social Determinants of Health   Financial Resource Strain:   . Difficulty of Paying Living Expenses: Not on file  Food Insecurity:   . Worried About Charity fundraiser in the Last Year: Not on file  . Ran Out of Food in the Last Year: Not on file  Transportation Needs:   . Lack of Transportation (Medical): Not on file  . Lack of Transportation (Non-Medical): Not on file  Physical Activity:   . Days of Exercise per Week: Not on file  . Minutes of Exercise per Session: Not on file  Stress:   . Feeling of Stress : Not on file  Social  Connections:   . Frequency of Communication with Friends and Family: Not on file  . Frequency of Social Gatherings with Friends and Family: Not on file  . Attends Religious Services: Not on file  . Active Member of Clubs or Organizations: Not on file  . Attends Archivist Meetings: Not on file  . Marital Status: Not on file  Intimate Partner Violence:   . Fear of Current or Ex-Partner: Not on file  . Emotionally Abused: Not on file  . Physically Abused: Not on file  . Sexually Abused: Not on file     PHYSICAL EXAM:  VS: BP 113/73   Pulse 83   Ht 5' (1.524 m)   Wt 140 lb (63.5 kg)   BMI 27.34 kg/m  Physical Exam Gen: NAD, alert, cooperative with exam, well-appearing ENT: normal lips, normal nasal mucosa,  Eye: normal EOM, normal conjunctiva and lids Skin: no rashes, no areas of induration  Neuro: normal tone, normal sensation to touch Psych:  normal insight,  alert and oriented MSK:  Left foot: No redness or tenderness at the base of the fifth. Normal flexion extension of the great toe at the IP joint. Tenderness at the distal phalanx. Limited flexion and extension of the distal phalanx secondary to pain. Neurovascularly intact  Limited ultrasound: Left great toe:  Degenerative changes of the first MTP joint. Normal-appearing proximal phalanx. There appears to be a nondisplaced transverse fracture of the distal phalanx that has some callus formation  Summary: Findings suggest a nondisplaced transverse fracture of the distal phalanx  Ultrasound and interpretation by Clearance Coots, MD    ASSESSMENT & PLAN:   Left foot pain The base of the fifth metatarsal has improved with the medication as well as the postop shoe.  It does appear she has a transverse fracture of the distal phalanx of the great toe that she sustained around 12/1. -Continue the postop shoe.  Can try to wean out once pain has improved. - Counseled on supportive care. -Follow-up in 4 weeks.  Can consider making custom orthotics at that time.

## 2019-03-06 NOTE — Assessment & Plan Note (Signed)
The base of the fifth metatarsal has improved with the medication as well as the postop shoe.  It does appear she has a transverse fracture of the distal phalanx of the great toe that she sustained around 12/1. -Continue the postop shoe.  Can try to wean out once pain has improved. - Counseled on supportive care. -Follow-up in 4 weeks.  Can consider making custom orthotics at that time.

## 2019-03-29 ENCOUNTER — Encounter: Payer: Self-pay | Admitting: Family Medicine

## 2019-03-29 ENCOUNTER — Ambulatory Visit (INDEPENDENT_AMBULATORY_CARE_PROVIDER_SITE_OTHER): Payer: Medicare Other | Admitting: Family Medicine

## 2019-03-29 DIAGNOSIS — Z20822 Contact with and (suspected) exposure to covid-19: Secondary | ICD-10-CM | POA: Diagnosis not present

## 2019-03-29 NOTE — Assessment & Plan Note (Signed)
Recommend testing for COVID-19 based on current symptoms.  She is instructed to isolate until test results return.  If test results are positive she will need to continue to isolate for minimum of 10 days and fever free x24 hours and improving symptoms.  Recommend supportive care at home.  Including maintaining good fluid intake and adequate nutrition.  Call for new or worsening symptoms.  She expresses understanding.

## 2019-03-29 NOTE — Addendum Note (Signed)
Addended by: Towana Badger on: 03/29/2019 03:26 PM   Modules accepted: Orders

## 2019-03-29 NOTE — Progress Notes (Signed)
Alexis Small - 79 y.o. female MRN [id number]  Date of birth: Apr 04, 1940   This visit type was conducted due to national recommendations for restrictions regarding the COVID-19 Pandemic (e.g. social distancing).  This format is felt to be most appropriate for this patient at this time.  All issues noted in this document were discussed and addressed.  No physical exam was performed (except for noted visual exam findings with Video Visits).  I discussed the limitations of evaluation and management by telemedicine and the availability of in person appointments. The patient expressed understanding and agreed to proceed.  I connected with@ on 03/29/19 at  3:00 PM EST by a video enabled telemedicine application and verified that I am speaking with the correct person using two identifiers.  Present at visit: Luetta Nutting, DO [place]   Patient Location: [place] [address]   Provider location:   [place]  Chief Complaint  Patient presents with  . Cough    wednesday head congestion, no fever, dizziness or bodyaches. Some nausea, no appetite    HPI  Alexis Small is a 79 y.o. female who presents via audio/video conferencing for a telehealth visit today.  She has complaint of 2 day history of fatigue, congestion, cough, mild shortness of breath, and headache.  She denies nausea, vomiting, diarrhea, body aches, fever or chills. She has no known sick contacts.     ROS:  A comprehensive ROS was completed and negative except as noted per HPI  Past Medical History:  Diagnosis Date  . Osteopenia 03/05/2008   Qualifier: Diagnosis of  By: Madilyn Fireman MD, Barnetta Chapel    . Shingles     Past Surgical History:  Procedure Laterality Date  . ABDOMINAL HYSTERECTOMY  1970's   Complete, 2x  . APPENDECTOMY  1970's  . BREAST SURGERY  1980's   reduction  . CORNEAL TRANSPLANT  11/2014   [facility]    Family History  Problem  Relation Age of Onset  . Aneurysm Mother 69       cerebral  . Coronary artery disease Sister 27       deceased age 40  . COPD Sister        smoker  . Lung cancer Sister   . Birth defects Neg Hx     Social History   Socioeconomic History  . Marital status: Widowed    Spouse name: Not on file  . Number of children: 3  . Years of education: Not on file  . Highest education level: Not on file  Occupational History    Employer: [employer]  Tobacco Use  . Smoking status: Never Smoker  . Smokeless tobacco: Never Used  Substance and Sexual Activity  . Alcohol use: No  . Drug use: No  . 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  Other Topics Concern  . Not on file  Social History Narrative   Walking for exercise.  No daily caffeine.    Social Determinants of Health   Financial Resource Strain:   . Difficulty of Paying Living Expenses: Not on file  Food Insecurity:   . Worried About Charity fundraiser in the Last Year: Not on file  . Ran Out of Food in the Last Year: Not on file  Transportation Needs:   . Lack of Transportation (Medical): Not on file  . Lack of Transportation (Non-Medical): Not  on file  Physical Activity:   . Days of Exercise per Week: Not on file  . Minutes of Exercise per Session: Not on file  Stress:   . Feeling of Stress : Not on file  Social Connections:   . Frequency of Communication with Friends and Family: Not on file  . Frequency of Social Gatherings with Friends and Family: Not on file  . Attends Religious Services: Not on file  . Active Member of Clubs or Organizations: Not on file  . Attends Archivist Meetings: Not on file  . Marital Status: Not on file  Intimate Partner Violence:   . Fear of Current or Ex-Partner: Not on file  . Emotionally Abused: Not on file  . Physically Abused: Not on file  . Sexually Abused: Not on file     Current  Outpatient Medications:  .  AMBULATORY NON FORMULARY MEDICATION, Take 2 tablets by mouth at bedtime. Medication Name: [facility] STRENGTH, Disp: , Rfl:  .  AMBULATORY NON FORMULARY MEDICATION, Take 1 tablet by mouth daily. Medication Name: Probiotic, Disp: , Rfl:  .  Ascorbic Acid (VITAMIN C PO), Take 1 tablet by mouth daily., Disp: , Rfl:  .  Calcium Carbonate-Vitamin D 600-200 MG-UNIT TABS, Take 1 tablet by mouth at bedtime., Disp: , Rfl:  .  Multiple Vitamins-Minerals (ZINC PO), Take 1 tablet by mouth daily., Disp: , Rfl:  .  Omega 3-6-9 Fatty Acids (OMEGA 3-6-9 COMPLEX PO), Take by mouth., Disp: , Rfl:   EXAM:  VITALS per patient if applicable: Temp (!) 99991111 F (35.4 C) (Oral)   Ht 5' (1.524 m)   Wt 140 lb (63.5 kg)   BMI 27.34 kg/m   GENERAL: alert, oriented,  no acute distress  PSYCH/NEURO: pleasant and cooperative, no obvious depression or anxiety, speech and thought processing grossly intact  ASSESSMENT AND PLAN:  Discussed the following assessment and plan:  Suspected COVID-19 virus infection Recommend testing for COVID-19 based on current symptoms.  She is instructed to isolate until test results return.  If test results are positive she will need to continue to isolate for minimum of 10 days and fever free x24 hours and improving symptoms.  Recommend supportive care at home.  Including maintaining good fluid intake and adequate nutrition.  Call for new or worsening symptoms.  She expresses understanding.   Time spent non face to face: 15 minutes.   I discussed the assessment and treatment plan with the patient. The patient was provided an opportunity to ask questions and all were answered. The patient agreed with the plan and demonstrated an understanding of the instructions.   The patient was advised to call back or seek an in-person evaluation if the symptoms worsen or if the condition fails to improve as anticipated.    Luetta Nutting, DO

## 2019-03-30 LAB — NOVEL CORONAVIRUS, NAA: SARS-CoV-2, NAA: NOT DETECTED

## 2019-04-17 ENCOUNTER — Encounter: Payer: Self-pay | Admitting: Family Medicine

## 2019-04-17 ENCOUNTER — Other Ambulatory Visit: Payer: Self-pay

## 2019-04-17 ENCOUNTER — Ambulatory Visit (INDEPENDENT_AMBULATORY_CARE_PROVIDER_SITE_OTHER): Payer: Medicare Other | Admitting: Family Medicine

## 2019-04-17 DIAGNOSIS — M79675 Pain in left toe(s): Secondary | ICD-10-CM | POA: Diagnosis not present

## 2019-04-17 DIAGNOSIS — M19079 Primary osteoarthritis, unspecified ankle and foot: Secondary | ICD-10-CM | POA: Insufficient documentation

## 2019-04-17 DIAGNOSIS — M21962 Unspecified acquired deformity of left lower leg: Secondary | ICD-10-CM

## 2019-04-17 NOTE — Progress Notes (Signed)
Medication Samples have been provided to the patient.  Drug name: Pennsaid       Strength: 2%        Qty: 2 Boxes  [id number]  Exp.Date: 05/2019  Dosing instructions: Use a pea size amount and rub gently.  The patient has been instructed regarding the correct time, dose, and frequency of taking this medication, including desired effects and most common side effects.   Sherrie George, [place] 3:56 PM 04/17/2019

## 2019-04-17 NOTE — Patient Instructions (Signed)
Good to see you Try to place the cushion on the shoes where it rubs  Please try ice  Please try the pennsaid  Please send me a message in MyChart with any questions or updates.  Please see me back in 2-3 months.   --Dr. Raeford Razor

## 2019-04-17 NOTE — Progress Notes (Signed)
Alexis Small - 79 y.o. female MRN [id number]  Date of birth: May 22, 1940  SUBJECTIVE:  Including CC & ROS.  Chief Complaint  Patient presents with  . Follow-up    follow up for left foot     Alexis Small is a 79 y.o. female that is following up for her left foot pain.  She experienced what appeared to be a fracture of the great toe as well as irritation of the base of the fifth metatarsal.  She has been using a postop shoe intermittently which tends to help with her pain.  Right now her pain is much improved compared to what it was previously.   Review of Systems See HPI   HISTORY: Past Medical, Surgical, Social, and Family History Reviewed & Updated per EMR.   Pertinent Historical Findings include:  Past Medical History:  Diagnosis Date  . Osteopenia 03/05/2008   Qualifier: Diagnosis of  By: Madilyn Fireman MD, Barnetta Chapel    . Shingles     Past Surgical History:  Procedure Laterality Date  . ABDOMINAL HYSTERECTOMY  1970's   Complete, 2x  . APPENDECTOMY  1970's  . BREAST SURGERY  1980's   reduction  . CORNEAL TRANSPLANT  11/2014   [facility]    Family History  Problem Relation Age of Onset  . Aneurysm Mother 26       cerebral  . Coronary artery disease Sister 50       deceased age 67  . COPD Sister        smoker  . Lung cancer Sister   . Birth defects Neg Hx     Social History   Socioeconomic History  . Marital status: Widowed    Spouse name: Not on file  . Number of children: 3  . Years of education: Not on file  . Highest education level: Not on file  Occupational History    Employer: [employer]  Tobacco Use  . Smoking status: Never Smoker  . Smokeless tobacco: Never Used  Substance and Sexual Activity  . Alcohol use: No  . Drug use: No  . 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  Other Topics Concern  . Not on file  Social History Narrative   Walking for  exercise.  No daily caffeine.    Social Determinants of Health   Financial Resource Strain:   . Difficulty of Paying Living Expenses: Not on file  Food Insecurity:   . Worried About Charity fundraiser in the Last Year: Not on file  . Ran Out of Food in the Last Year: Not on file  Transportation Needs:   . Lack of Transportation (Medical): Not on file  . Lack of Transportation (Non-Medical): Not on file  Physical Activity:   . Days of Exercise per Week: Not on file  . Minutes of Exercise per Session: Not on file  Stress:   . Feeling of Stress : Not on file  Social Connections:   . Frequency of Communication with Friends and Family: Not on file  . Frequency of Social Gatherings with Friends and Family: Not on file  . Attends Religious Services: Not on file  . Active Member of Clubs or Organizations: Not on file  . Attends Archivist Meetings: Not on file  . Marital Status: Not on file  Intimate Partner Violence:   . Fear of Current or Ex-Partner: Not on file  . Emotionally  Abused: Not on file  . Physically Abused: Not on file  . Sexually Abused: Not on file     PHYSICAL EXAM:  VS: BP 136/72   Pulse 69   Ht 5' (1.524 m)   Wt 140 lb (63.5 kg)   BMI 27.34 kg/m  Physical Exam Gen: NAD, alert, cooperative with exam, well-appearing MSK:  Left foot: Hallux valgus. Some limitations with flexion extension of the great toe. Degenerative changes appreciated the base of the fifth with mild redness and swelling in this area. Normal range of motion of the ankle. Normal strength resistance. Neurovascularly intact     ASSESSMENT & PLAN:   Metatarsal deformity, left She has significant change at the base of the fifth metatarsal.  It does not appear to be related to the peroneal brevis and does seem to get irritation when this area is rubbed with her shoes.  She did respond with prednisone in the past. -Provided cushioning to place on shoes to reduce the irritation in  this area. -Provided samples of Pennsaid.   Great toe pain, left Previously an ultrasound suggested a transverse fracture of the great toe.  This occurred in December.  She does use a postop shoe intermittently and reports improvement of her pain. -Slowly wean out of the postop shoe. -Counseled on supportive care. -Could always consider x-ray. -Counseled on vitamin D and calcium.

## 2019-04-18 DIAGNOSIS — M21962 Unspecified acquired deformity of left lower leg: Secondary | ICD-10-CM | POA: Insufficient documentation

## 2019-04-18 NOTE — Assessment & Plan Note (Signed)
Previously an ultrasound suggested a transverse fracture of the great toe.  This occurred in December.  She does use a postop shoe intermittently and reports improvement of her pain. -Slowly wean out of the postop shoe. -Counseled on supportive care. -Could always consider x-ray. -Counseled on vitamin D and calcium.

## 2019-04-18 NOTE — Assessment & Plan Note (Signed)
She has significant change at the base of the fifth metatarsal.  It does not appear to be related to the peroneal brevis and does seem to get irritation when this area is rubbed with her shoes.  She did respond with prednisone in the past. -Provided cushioning to place on shoes to reduce the irritation in this area. -Provided samples of Pennsaid.

## 2019-05-19 DIAGNOSIS — H9190 Unspecified hearing loss, unspecified ear: Secondary | ICD-10-CM | POA: Diagnosis not present

## 2019-06-30 DIAGNOSIS — H5712 Ocular pain, left eye: Secondary | ICD-10-CM | POA: Diagnosis not present

## 2019-06-30 DIAGNOSIS — Z9841 Cataract extraction status, right eye: Secondary | ICD-10-CM | POA: Diagnosis not present

## 2019-06-30 DIAGNOSIS — Z961 Presence of intraocular lens: Secondary | ICD-10-CM | POA: Diagnosis not present

## 2019-06-30 DIAGNOSIS — Z79899 Other long term (current) drug therapy: Secondary | ICD-10-CM | POA: Diagnosis not present

## 2019-06-30 DIAGNOSIS — Z885 Allergy status to narcotic agent status: Secondary | ICD-10-CM | POA: Diagnosis not present

## 2019-06-30 DIAGNOSIS — Z882 Allergy status to sulfonamides status: Secondary | ICD-10-CM | POA: Diagnosis not present

## 2019-06-30 DIAGNOSIS — Z9842 Cataract extraction status, left eye: Secondary | ICD-10-CM | POA: Diagnosis not present

## 2019-06-30 DIAGNOSIS — M8580 Other specified disorders of bone density and structure, unspecified site: Secondary | ICD-10-CM | POA: Diagnosis not present

## 2019-06-30 DIAGNOSIS — G4700 Insomnia, unspecified: Secondary | ICD-10-CM | POA: Diagnosis not present

## 2019-06-30 DIAGNOSIS — S0502XA Injury of conjunctiva and corneal abrasion without foreign body, left eye, initial encounter: Secondary | ICD-10-CM | POA: Diagnosis not present

## 2019-06-30 DIAGNOSIS — M1990 Unspecified osteoarthritis, unspecified site: Secondary | ICD-10-CM | POA: Diagnosis not present

## 2019-07-02 DIAGNOSIS — Z947 Corneal transplant status: Secondary | ICD-10-CM | POA: Diagnosis not present

## 2019-07-02 DIAGNOSIS — H40003 Preglaucoma, unspecified, bilateral: Secondary | ICD-10-CM | POA: Diagnosis not present

## 2019-07-03 ENCOUNTER — Inpatient Hospital Stay: Payer: Medicare Other | Admitting: Family Medicine

## 2019-07-30 ENCOUNTER — Telehealth: Payer: Self-pay | Admitting: Family Medicine

## 2019-07-30 NOTE — Telephone Encounter (Signed)
Discuss about her question about shoes.   Rosemarie Ax, MD [facility] 07/30/2019, 1:23 PM

## 2019-08-21 DIAGNOSIS — M24478 Recurrent dislocation, left toe(s): Secondary | ICD-10-CM | POA: Diagnosis not present

## 2019-08-21 DIAGNOSIS — M7742 Metatarsalgia, left foot: Secondary | ICD-10-CM | POA: Diagnosis not present

## 2019-08-21 DIAGNOSIS — M2012 Hallux valgus (acquired), left foot: Secondary | ICD-10-CM | POA: Diagnosis not present

## 2019-08-21 DIAGNOSIS — M7741 Metatarsalgia, right foot: Secondary | ICD-10-CM | POA: Diagnosis not present

## 2019-09-17 ENCOUNTER — Telehealth: Payer: Self-pay

## 2019-09-17 NOTE — Telephone Encounter (Signed)
Pfizer or moderna

## 2019-09-17 NOTE — Telephone Encounter (Signed)
Patient advised.

## 2019-09-17 NOTE — Telephone Encounter (Signed)
Alexis Small would like to know which Covid vaccine would Dr Madilyn Fireman recommends.

## 2019-09-17 NOTE — Telephone Encounter (Signed)
Left message advising of recommendations.  

## 2019-10-01 ENCOUNTER — Telehealth: Payer: Medicare Other | Admitting: Family Medicine

## 2019-11-04 ENCOUNTER — Encounter: Payer: Self-pay | Admitting: Family Medicine

## 2019-11-04 ENCOUNTER — Ambulatory Visit (INDEPENDENT_AMBULATORY_CARE_PROVIDER_SITE_OTHER): Payer: Medicare Other | Admitting: Family Medicine

## 2019-11-04 VITALS — BP 136/65 | HR 75 | Ht 60.0 in | Wt 142.0 lb

## 2019-11-04 DIAGNOSIS — Z0000 Encounter for general adult medical examination without abnormal findings: Secondary | ICD-10-CM

## 2019-11-04 NOTE — Patient Instructions (Signed)
Preventive Care 38 Years and Older, Female Preventive care refers to lifestyle choices and visits with your health care provider that can promote health and wellness. This includes:  A yearly physical exam. This is also called an annual well check.  Regular dental and eye exams.  Immunizations.  Screening for certain conditions.  Healthy lifestyle choices, such as diet and exercise. What can I expect for my preventive care visit? Physical exam Your health care provider will check:  Height and weight. These may be used to calculate body mass index (BMI), which is a measurement that tells if you are at a healthy weight.  Heart rate and blood pressure.  Your skin for abnormal spots. Counseling Your health care provider may ask you questions about:  Alcohol, tobacco, and drug use.  Emotional well-being.  Home and relationship well-being.  Sexual activity.  Eating habits.  History of falls.  Memory and ability to understand (cognition).  Work and work Statistician.  Pregnancy and menstrual history. What immunizations do I need?  Influenza (flu) vaccine  This is recommended every year. Tetanus, diphtheria, and pertussis (Tdap) vaccine  You may need a Td booster every 10 years. Varicella (chickenpox) vaccine  You may need this vaccine if you have not already been vaccinated. Zoster (shingles) vaccine  You may need this after age 33. Pneumococcal conjugate (PCV13) vaccine  One dose is recommended after age 33. Pneumococcal polysaccharide (PPSV23) vaccine  One dose is recommended after age 72. Measles, mumps, and rubella (MMR) vaccine  You may need at least one dose of MMR if you were born in 1957 or later. You may also need a second dose. Meningococcal conjugate (MenACWY) vaccine  You may need this if you have certain conditions. Hepatitis A vaccine  You may need this if you have certain conditions or if you travel or work in places where you may be exposed  to hepatitis A. Hepatitis B vaccine  You may need this if you have certain conditions or if you travel or work in places where you may be exposed to hepatitis B. Haemophilus influenzae type b (Hib) vaccine  You may need this if you have certain conditions. You may receive vaccines as individual doses or as more than one vaccine together in one shot (combination vaccines). Talk with your health care provider about the risks and benefits of combination vaccines. What tests do I need? Blood tests  Lipid and cholesterol levels. These may be checked every 5 years, or more frequently depending on your overall health.  Hepatitis C test.  Hepatitis B test. Screening  Lung cancer screening. You may have this screening every year starting at age 39 if you have a 30-pack-year history of smoking and currently smoke or have quit within the past 15 years.  Colorectal cancer screening. All adults should have this screening starting at age 36 and continuing until age 15. Your health care provider may recommend screening at age 23 if you are at increased risk. You will have tests every 1-10 years, depending on your results and the type of screening test.  Diabetes screening. This is done by checking your blood sugar (glucose) after you have not eaten for a while (fasting). You may have this done every 1-3 years.  Mammogram. This may be done every 1-2 years. Talk with your health care provider about how often you should have regular mammograms.  BRCA-related cancer screening. This may be done if you have a family history of breast, ovarian, tubal, or peritoneal cancers.  Other tests  Sexually transmitted disease (STD) testing.  Bone density scan. This is done to screen for osteoporosis. You may have this done starting at age 93. Follow these instructions at home: Eating and drinking  Eat a diet that includes fresh fruits and vegetables, whole grains, lean protein, and low-fat dairy products. Limit  your intake of foods with high amounts of sugar, saturated fats, and salt.  Take vitamin and mineral supplements as recommended by your health care provider.  Do not drink alcohol if your health care provider tells you not to drink.  If you drink alcohol: ? Limit how much you have to 0-1 drink a day. ? Be aware of how much alcohol is in your drink. In the [place] one drink equals one 12 oz bottle of beer (355 mL), one 5 oz glass of wine (148 mL), or one 1 oz glass of hard liquor (44 mL). Lifestyle  Take daily care of your teeth and gums.  Stay active. Exercise for at least 30 minutes on 5 or more days each week.  Do not use any products that contain nicotine or tobacco, such as cigarettes, e-cigarettes, and chewing tobacco. If you need help quitting, ask your health care provider.  If you are sexually active, practice safe sex. Use a condom or other form of protection in order to prevent STIs (sexually transmitted infections).  Talk with your health care provider about taking a low-dose aspirin or statin. What's next?  Go to your health care provider once a year for a well check visit.  Ask your health care provider how often you should have your eyes and teeth checked.  Stay up to date on all vaccines. This information is not intended to replace advice given to you by your health care provider. Make sure you discuss any questions you have with your health care provider. Document Revised: 02/01/2018 Document Reviewed: 02/01/2018 Elsevier Patient Education  2020 [employer].

## 2019-11-04 NOTE — Progress Notes (Signed)
Established Patient Office Visit  Subjective:  Patient ID: Alexis Small, female    DOB: 1941-01-16  Age: 79 y.o. MRN: [id number]  CC:  Chief Complaint  Patient presents with  . Annual Exam    HPI Alexis Small presents for CPE.  She is doing well overall she stays very active helping to take care of of her grandson.  He exercises about 40 minutes/day and has been doing that pretty consistently for about 6 weeks.  Sleep is fair.  She will occasionally take a Benadryl to help.  Have a sister with lung cancer.  Past Medical History:  Diagnosis Date  . Osteopenia 03/05/2008   Qualifier: Diagnosis of  By: Madilyn Fireman MD, Barnetta Chapel    . Shingles     Past Surgical History:  Procedure Laterality Date  . ABDOMINAL HYSTERECTOMY  1970's   Complete, 2x  . APPENDECTOMY  1970's  . BREAST SURGERY  1980's   reduction  . CORNEAL TRANSPLANT  11/2014   [facility]    Family History  Problem Relation Age of Onset  . Aneurysm Mother 9       cerebral  . Coronary artery disease Sister 45       deceased age 69  . COPD Sister        smoker  . Lung cancer Sister   . Birth defects Neg Hx     Social History   Socioeconomic History  . Marital status: Widowed    Spouse name: Not on file  . Number of children: 3  . Years of education: Not on file  . Highest education level: Not on file  Occupational History    Employer: [employer]  Tobacco Use  . Smoking status: Never Smoker  . Smokeless tobacco: Never Used  Substance and Sexual Activity  . Alcohol use: No  . Drug use: No  . 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  Other Topics Concern  . Not on file  Social History Narrative   Walking for exercise.  No daily caffeine.    Social Determinants of Health   Financial Resource Strain:   . Difficulty of Paying Living Expenses: Not on file  Food Insecurity:   . Worried About Paediatric nurse in the Last Year: Not on file  . Ran Out of Food in the Last Year: Not on file  Transportation Needs:   . Lack of Transportation (Medical): Not on file  . Lack of Transportation (Non-Medical): Not on file  Physical Activity:   . Days of Exercise per Week: Not on file  . Minutes of Exercise per Session: Not on file  Stress:   . Feeling of Stress : Not on file  Social Connections:   . Frequency of Communication with Friends and Family: Not on file  . Frequency of Social Gatherings with Friends and Family: Not on file  . Attends Religious Services: Not on file  . Active Member of Clubs or Organizations: Not on file  . Attends Archivist Meetings: Not on file  . Marital Status: Not on file  Intimate Partner Violence:   . Fear of Current or Ex-Partner: Not on file  . Emotionally Abused: Not on file  . Physically Abused: Not on file  . Sexually Abused: Not on file    Outpatient Medications Prior to Visit  Medication Sig Dispense Refill  . AMBULATORY NON FORMULARY MEDICATION Take  2 tablets by mouth at bedtime. Medication Name: [facility] STRENGTH    . AMBULATORY NON FORMULARY MEDICATION Take 1 tablet by mouth daily. Medication Name: Probiotic    . Ascorbic Acid (VITAMIN C PO) Take 1 tablet by mouth daily.    . Calcium Carbonate-Vitamin D 600-200 MG-UNIT TABS Take 1 tablet by mouth at bedtime.    . Multiple Vitamins-Minerals (ZINC PO) Take by mouth.    . Omega 3-6-9 Fatty Acids (OMEGA 3-6-9 COMPLEX PO) Take by mouth.    . Multiple Vitamins-Minerals (ZINC PO) Take 1 tablet by mouth daily.     No facility-administered medications prior to visit.    Allergies  Allergen Reactions  . Oxycodone-Acetaminophen     Other reaction(s): Other (See Comments) "[place]"  . Prednisone Other (See Comments)    Sweats, chest pain, etc  . Sulfamethoxazole     Other reaction(s): Other (See Comments) unknown    ROS Review of Systems    Objective:    Physical  Exam Constitutional:      Appearance: She is well-developed.  HENT:     Head: Normocephalic and atraumatic.     Right Ear: Tympanic membrane, ear canal and external ear normal.     Left Ear: Tympanic membrane, ear canal and external ear normal.     Nose: Nose normal.  Eyes:     Conjunctiva/sclera: Conjunctivae normal.     Pupils: Pupils are equal, round, and reactive to light.  Neck:     Thyroid: No thyromegaly.  Cardiovascular:     Rate and Rhythm: Normal rate and regular rhythm.     Heart sounds: Normal heart sounds.  Pulmonary:     Effort: Pulmonary effort is normal.     Breath sounds: Normal breath sounds. No wheezing.  Musculoskeletal:     Cervical back: Neck supple.  Lymphadenopathy:     Cervical: No cervical adenopathy.  Skin:    General: Skin is warm and dry.  Neurological:     General: No focal deficit present.     Mental Status: She is alert and oriented to person, place, and time.     Deep Tendon Reflexes: Reflexes normal.  Psychiatric:        Mood and Affect: Mood normal.        Behavior: Behavior normal.     BP 136/65   Pulse 75   Ht 5' (1.524 m)   Wt 142 lb (64.4 kg)   SpO2 100%   BMI 27.73 kg/m  Wt Readings from Last 3 Encounters:  11/04/19 142 lb (64.4 kg)  04/17/19 140 lb (63.5 kg)  03/29/19 140 lb (63.5 kg)     Health Maintenance Due  Topic Date Due  . Hepatitis C Screening  Never done    There are no preventive care reminders to display for this patient.  Lab Results  Component Value Date   TSH 1.40 06/12/2017   Lab Results  Component Value Date   WBC 5.3 04/19/2018   HGB 13.1 04/19/2018   HCT 39.0 04/19/2018   MCV 88.2 04/19/2018   PLT 210 04/19/2018   Lab Results  Component Value Date   NA 139 04/19/2018   K 4.1 04/19/2018   CO2 27 04/19/2018   GLUCOSE 83 04/19/2018   BUN 16 04/19/2018   CREATININE 0.90 04/19/2018   BILITOT 0.5 04/19/2018   ALKPHOS 71 10/30/2015   AST 15 04/19/2018   ALT 9 04/19/2018   PROT 6.9  04/19/2018   ALBUMIN 4.3 10/30/2015  CALCIUM 9.3 04/19/2018   Lab Results  Component Value Date   CHOL 166 04/19/2018   Lab Results  Component Value Date   HDL 82 04/19/2018   Lab Results  Component Value Date   LDLCALC 67 04/19/2018   Lab Results  Component Value Date   TRIG 90 04/19/2018   Lab Results  Component Value Date   CHOLHDL 2.0 04/19/2018   No results found for: HGBA1C    Assessment & Plan:   Problem List Items Addressed This Visit    None    Visit Diagnoses    Wellness examination    -  Primary   Relevant Orders   CBC   COMPLETE METABOLIC PANEL WITH GFR   Lipid panel   TSH     Keep up a regular exercise program and make sure you are eating a healthy diet Try to eat 4 servings of dairy a day, or if you are lactose intolerant take a calcium with vitamin D daily.  Your vaccines are up to date.  We will get updated screening labs today.  Last lipid panel was about a year and a half ago.  Continue with regular exercise.  Bone density is up-to-date. She does get regular eye exams.   No orders of the defined types were placed in this encounter.   Follow-up: Return in about 1 year (around 11/03/2020) for Wellness Exam.    Beatrice Lecher, MD

## 2019-11-05 LAB — CBC
HCT: 41.1 % (ref 35.0–45.0)
Hemoglobin: 13.4 g/dL (ref 11.7–15.5)
MCH: 29.3 pg (ref 27.0–33.0)
MCHC: 32.6 g/dL (ref 32.0–36.0)
MCV: 89.7 fL (ref 80.0–100.0)
MPV: 11.4 fL (ref 7.5–12.5)
Platelets: 194 10*3/uL (ref 140–400)
RBC: 4.58 10*6/uL (ref 3.80–5.10)
RDW: 13.3 % (ref 11.0–15.0)
WBC: 5.7 10*3/uL (ref 3.8–10.8)

## 2019-11-05 LAB — COMPLETE METABOLIC PANEL WITH GFR
AG Ratio: 1.5 (calc) (ref 1.0–2.5)
ALT: 10 U/L (ref 6–29)
AST: 17 U/L (ref 10–35)
Albumin: 4.1 g/dL (ref 3.6–5.1)
Alkaline phosphatase (APISO): 64 U/L (ref 37–153)
BUN: 15 mg/dL (ref 7–25)
CO2: 27 mmol/L (ref 20–32)
Calcium: 9 mg/dL (ref 8.6–10.4)
Chloride: 103 mmol/L (ref 98–110)
Creat: 0.82 mg/dL (ref 0.60–0.93)
GFR, Est African American: 79 mL/min/{1.73_m2} (ref >=60)
GFR, Est Non African American: 68 mL/min/{1.73_m2} (ref >=60)
Globulin: 2.8 g/dL (calc) (ref 1.9–3.7)
Glucose, Bld: 82 mg/dL (ref 65–99)
Potassium: 4 mmol/L (ref 3.5–5.3)
Sodium: 138 mmol/L (ref 135–146)
Total Bilirubin: 0.7 mg/dL (ref 0.2–1.2)
Total Protein: 6.9 g/dL (ref 6.1–8.1)

## 2019-11-05 LAB — LIPID PANEL
Cholesterol: 175 mg/dL (ref <200)
HDL: 92 mg/dL (ref >=50)
LDL Cholesterol (Calc): 66 mg/dL (calc)
Non-HDL Cholesterol (Calc): 83 mg/dL (calc) (ref <130)
Total CHOL/HDL Ratio: 1.9 (calc) (ref <5.0)
Triglycerides: 85 mg/dL (ref <150)

## 2019-11-05 LAB — TSH: TSH: 1.82 mIU/L (ref 0.40–4.50)

## 2019-11-27 DIAGNOSIS — D485 Neoplasm of uncertain behavior of skin: Secondary | ICD-10-CM | POA: Diagnosis not present

## 2019-11-27 DIAGNOSIS — D225 Melanocytic nevi of trunk: Secondary | ICD-10-CM | POA: Diagnosis not present

## 2019-11-27 DIAGNOSIS — L821 Other seborrheic keratosis: Secondary | ICD-10-CM | POA: Diagnosis not present

## 2019-11-27 DIAGNOSIS — L905 Scar conditions and fibrosis of skin: Secondary | ICD-10-CM | POA: Diagnosis not present

## 2019-11-27 DIAGNOSIS — L718 Other rosacea: Secondary | ICD-10-CM | POA: Diagnosis not present

## 2020-06-29 ENCOUNTER — Ambulatory Visit: Payer: Medicare Other | Admitting: Family Medicine

## 2020-07-02 ENCOUNTER — Other Ambulatory Visit: Payer: Self-pay

## 2020-07-02 ENCOUNTER — Encounter: Payer: Self-pay | Admitting: Family Medicine

## 2020-07-02 ENCOUNTER — Ambulatory Visit (INDEPENDENT_AMBULATORY_CARE_PROVIDER_SITE_OTHER): Payer: Medicare Other | Admitting: Family Medicine

## 2020-07-02 VITALS — BP 122/82 | HR 83 | Ht 60.0 in | Wt 142.0 lb

## 2020-07-02 DIAGNOSIS — L720 Epidermal cyst: Secondary | ICD-10-CM

## 2020-07-02 NOTE — Progress Notes (Signed)
Acute Office Visit  Subjective:    Patient ID: Alexis Small, female    DOB: 16-Apr-1940, 80 y.o.   MRN: [id number]  Chief Complaint  Patient presents with  . Rash    HPI Patient is in today for lesion on top of her shoulder that is red and sore.  No drainage.  Using dial, neosproin and alcohol.   She had 2 similar lesions on her buttock area near the crease but those have actually resolved on their own.  She says the one on the top of her right shoulder is getting better she says it was a most double the size it is currently.  Says she noticed it initially after she had been working out in the yard and been sweating.  Not think that they were insect bites.  Past Medical History:  Diagnosis Date  . Osteopenia 03/05/2008   Qualifier: Diagnosis of  By: Madilyn Fireman MD, Barnetta Chapel    . Shingles     Past Surgical History:  Procedure Laterality Date  . ABDOMINAL HYSTERECTOMY  1970's   Complete, 2x  . APPENDECTOMY  1970's  . BREAST SURGERY  1980's   reduction  . CORNEAL TRANSPLANT  11/2014   [facility]    Family History  Problem Relation Age of Onset  . Aneurysm Mother 37       cerebral  . Coronary artery disease Sister 55       deceased age 72  . COPD Sister        smoker  . Lung cancer Sister   . Birth defects Neg Hx     Social History   Socioeconomic History  . Marital status: Widowed    Spouse name: Not on file  . Number of children: 3  . Years of education: Not on file  . Highest education level: Not on file  Occupational History    Employer: [employer]  Tobacco Use  . Smoking status: Never Smoker  . Smokeless tobacco: Never Used  Substance and Sexual Activity  . Alcohol use: No  . Drug use: No  . 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  Other Topics Concern  . Not on file  Social History Narrative   Walking for exercise.  No daily caffeine.    Social Determinants of  Health   Financial Resource Strain: Not on file  Food Insecurity: Not on file  Transportation Needs: Not on file  Physical Activity: Not on file  Stress: Not on file  Social Connections: Not on file  Intimate Partner Violence: Not on file    Outpatient Medications Prior to Visit  Medication Sig Dispense Refill  . AMBULATORY NON FORMULARY MEDICATION Take 2 tablets by mouth at bedtime. Medication Name: [facility] STRENGTH    . Calcium Carbonate-Vitamin D 600-200 MG-UNIT TABS Take 1 tablet by mouth at bedtime.    . Multiple Vitamins-Minerals (ZINC PO) Take by mouth.    . Omega 3-6-9 Fatty Acids (OMEGA 3-6-9 COMPLEX PO) Take by mouth.    . AMBULATORY NON FORMULARY MEDICATION Take 1 tablet by mouth daily. Medication Name: Probiotic    . Ascorbic Acid (VITAMIN C PO) Take 1 tablet by mouth daily.     No facility-administered medications prior to visit.    Allergies  Allergen Reactions  . Oxycodone-Acetaminophen     Other reaction(s): Other (See Comments) "[place]"  . Prednisone Other (See Comments)    Sweats,  chest pain, etc  . Sulfamethoxazole     Other reaction(s): Other (See Comments) unknown    Review of Systems     Objective:    Physical Exam Vitals reviewed.  Constitutional:      Appearance: She is well-developed.  HENT:     Head: Normocephalic and atraumatic.  Eyes:     Conjunctiva/sclera: Conjunctivae normal.  Cardiovascular:     Rate and Rhythm: Normal rate.  Pulmonary:     Effort: Pulmonary effort is normal.  Skin:    General: Skin is dry.     Coloration: Skin is not pale.     Comments: The top of the right shoulder she has an approximately 2 cm erythematous area with about a 1 cm papule underneath the center.  No active drainage or open wound.  No pustule.  Neurological:     Mental Status: She is alert and oriented to person, place, and time.  Psychiatric:        Behavior: Behavior normal.     BP 122/82   Pulse 83   Ht 5' (1.524 m)   Wt 142  lb (64.4 kg)   SpO2 98%   BMI 27.73 kg/m  Wt Readings from Last 3 Encounters:  07/02/20 142 lb (64.4 kg)  11/04/19 142 lb (64.4 kg)  04/17/19 140 lb (63.5 kg)    Health Maintenance Due  Topic Date Due  . Hepatitis C Screening  Never done  . PNA vac Low Risk Adult (2 of 2 - PCV13) 12/14/2012  . COVID-19 Vaccine (2 - Moderna risk 4-dose series) 11/09/2019    There are no preventive care reminders to display for this patient.   Lab Results  Component Value Date   TSH 1.82 11/04/2019   Lab Results  Component Value Date   WBC 5.7 11/04/2019   HGB 13.4 11/04/2019   HCT 41.1 11/04/2019   MCV 89.7 11/04/2019   PLT 194 11/04/2019   Lab Results  Component Value Date   NA 138 11/04/2019   K 4.0 11/04/2019   CO2 27 11/04/2019   GLUCOSE 82 11/04/2019   BUN 15 11/04/2019   CREATININE 0.82 11/04/2019   BILITOT 0.7 11/04/2019   ALKPHOS 71 10/30/2015   AST 17 11/04/2019   ALT 10 11/04/2019   PROT 6.9 11/04/2019   ALBUMIN 4.3 10/30/2015   CALCIUM 9.0 11/04/2019   Lab Results  Component Value Date   CHOL 175 11/04/2019   Lab Results  Component Value Date   HDL 92 11/04/2019   Lab Results  Component Value Date   LDLCALC 66 11/04/2019   Lab Results  Component Value Date   TRIG 85 11/04/2019   Lab Results  Component Value Date   CHOLHDL 1.9 11/04/2019   No results found for: HGBA1C     Assessment & Plan:   Problem List Items Addressed This Visit   None   Visit Diagnoses    Epidermal cyst    -  Primary     She has what looks like an inflamed epidermal cyst and or very small abscess.  It does not look like it needs to be drained today.  There is no actual drainage or head on the lesions.  Since it seems to be resolving on its own just encouraged her to continue her current regimen except for maybe adding in warm compresses which will increase blood flow to the area and improve healing and help it drain come to ahead if it needs to drain.  If she is not getting  better or improving then just give [place] call back if we need to prescribe antibiotics.  Did encourage her to stop the Neosporin since there is no superficial infection.  No orders of the defined types were placed in this encounter.    Beatrice Lecher, MD

## 2020-07-21 DIAGNOSIS — Z947 Corneal transplant status: Secondary | ICD-10-CM | POA: Diagnosis not present

## 2020-07-21 DIAGNOSIS — H40003 Preglaucoma, unspecified, bilateral: Secondary | ICD-10-CM | POA: Diagnosis not present

## 2020-09-18 DIAGNOSIS — L819 Disorder of pigmentation, unspecified: Secondary | ICD-10-CM | POA: Diagnosis not present

## 2020-09-18 DIAGNOSIS — L718 Other rosacea: Secondary | ICD-10-CM | POA: Diagnosis not present

## 2020-09-18 DIAGNOSIS — R202 Paresthesia of skin: Secondary | ICD-10-CM | POA: Diagnosis not present

## 2020-10-29 ENCOUNTER — Telehealth: Payer: Self-pay | Admitting: Family Medicine

## 2020-10-29 NOTE — Telephone Encounter (Signed)
Spoke to patient to schedule Medicare Annual Wellness Visit (AWV) either virtually or in office.  Patient declined saw no reason for it    Last AWV 10/30/15 please schedule at anytime with health coach

## 2020-11-06 DIAGNOSIS — L298 Other pruritus: Secondary | ICD-10-CM | POA: Diagnosis not present

## 2020-11-06 DIAGNOSIS — L538 Other specified erythematous conditions: Secondary | ICD-10-CM | POA: Diagnosis not present

## 2020-11-06 DIAGNOSIS — L820 Inflamed seborrheic keratosis: Secondary | ICD-10-CM | POA: Diagnosis not present

## 2020-11-06 DIAGNOSIS — D485 Neoplasm of uncertain behavior of skin: Secondary | ICD-10-CM | POA: Diagnosis not present

## 2020-11-26 DIAGNOSIS — L821 Other seborrheic keratosis: Secondary | ICD-10-CM | POA: Diagnosis not present

## 2020-11-26 DIAGNOSIS — L570 Actinic keratosis: Secondary | ICD-10-CM | POA: Diagnosis not present

## 2020-11-26 DIAGNOSIS — L814 Other melanin hyperpigmentation: Secondary | ICD-10-CM | POA: Diagnosis not present

## 2020-11-26 DIAGNOSIS — S40261A Insect bite (nonvenomous) of right shoulder, initial encounter: Secondary | ICD-10-CM | POA: Diagnosis not present

## 2020-11-26 DIAGNOSIS — Z85828 Personal history of other malignant neoplasm of skin: Secondary | ICD-10-CM | POA: Diagnosis not present

## 2020-11-26 DIAGNOSIS — L820 Inflamed seborrheic keratosis: Secondary | ICD-10-CM | POA: Diagnosis not present

## 2020-11-26 DIAGNOSIS — D225 Melanocytic nevi of trunk: Secondary | ICD-10-CM | POA: Diagnosis not present

## 2020-11-26 DIAGNOSIS — L308 Other specified dermatitis: Secondary | ICD-10-CM | POA: Diagnosis not present

## 2020-11-26 DIAGNOSIS — Z08 Encounter for follow-up examination after completed treatment for malignant neoplasm: Secondary | ICD-10-CM | POA: Diagnosis not present

## 2020-11-26 DIAGNOSIS — I8393 Asymptomatic varicose veins of bilateral lower extremities: Secondary | ICD-10-CM | POA: Diagnosis not present

## 2020-12-25 ENCOUNTER — Encounter: Payer: Self-pay | Admitting: Family Medicine

## 2020-12-25 ENCOUNTER — Ambulatory Visit (INDEPENDENT_AMBULATORY_CARE_PROVIDER_SITE_OTHER): Payer: Medicare Other | Admitting: Family Medicine

## 2020-12-25 ENCOUNTER — Other Ambulatory Visit: Payer: Self-pay

## 2020-12-25 VITALS — BP 142/81 | HR 75 | Ht 60.0 in | Wt 140.0 lb

## 2020-12-25 DIAGNOSIS — R051 Acute cough: Secondary | ICD-10-CM

## 2020-12-25 DIAGNOSIS — R229 Localized swelling, mass and lump, unspecified: Secondary | ICD-10-CM | POA: Diagnosis not present

## 2020-12-25 NOTE — Progress Notes (Addendum)
Acute Office Visit  Subjective:    Patient ID: Alexis Small, female    DOB: 1940-05-11, 79 y.o.   MRN: [id number]  Chief Complaint  Patient presents with   Mass    Pt reports that she noticed a lump on her collar bone R side while looking in the mirror yesterday. Denies any pain or injury.     HPI Patient is in today for lump on the medial end of her collar bone.  She says she noticed it in the mirror.  No pain or tenderness.  She does sleep on her right side at night.  She also wanted to let me know that for approximately 10 years she will choke when she has turned her head to the left side for several minutes.  She notices it mostly at church if she sits on a certain side and is watching the pastor where she has to turn her head.  Otherwise it does not bother her she has no problem swallowing or talking.  But it will trigger a significant cough.  Past Medical History:  Diagnosis Date   Osteopenia 03/05/2008   Qualifier: Diagnosis of  By: Madilyn Fireman MD, Lidia Collum     Past Surgical History:  Procedure Laterality Date   ABDOMINAL HYSTERECTOMY  1970's   Complete, 2x   APPENDECTOMY  1970's   BREAST SURGERY  1980's   reduction   CORNEAL TRANSPLANT  11/2014   [facility]    Family History  Problem Relation Age of Onset   Aneurysm Mother 32       cerebral   Coronary artery disease Sister 91       deceased age 86   COPD Sister        smoker   Lung cancer Sister    Birth defects Neg Hx     Social History   Socioeconomic History   Marital status: Widowed    Spouse name: Not on file   Number of children: 3   Years of education: Not on file   Highest education level: Not on file  Occupational History    Employer: [employer]  Tobacco Use   Smoking status: Never   Smokeless tobacco: Never  Substance and Sexual Activity   Alcohol use: No   Drug use: No   Sexual activity: Not on file    Comment: claims adjuster, 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  Other Topics Concern   Not on file  Social History Narrative   Walking for exercise.  No daily caffeine.    Social Determinants of Health   Financial Resource Strain: Not on file  Food Insecurity: Not on file  Transportation Needs: Not on file  Physical Activity: Not on file  Stress: Not on file  Social Connections: Not on file  Intimate Partner Violence: Not on file    Outpatient Medications Prior to Visit  Medication Sig Dispense Refill   AMBULATORY NON FORMULARY MEDICATION Take 2 tablets by mouth at bedtime. Medication Name: [facility] STRENGTH     Calcium Carbonate-Vitamin D 600-200 MG-UNIT TABS Take 1 tablet by mouth at bedtime.     Multiple Vitamins-Minerals (ZINC PO) Take by mouth.     Omega 3-6-9 Fatty Acids (OMEGA 3-6-9 COMPLEX PO) Take by mouth.     No facility-administered medications prior to visit.    Allergies  Allergen Reactions   Oxycodone-Acetaminophen     Other reaction(s): Other (See Comments) "[place]"  Prednisone Other (See Comments)    Sweats, chest pain, etc   Sulfamethoxazole     Other reaction(s): Other (See Comments) unknown    Review of Systems     Objective:    Physical Exam Constitutional:      Appearance: Normal appearance.  Neck:     Comments: She has a significant enlargement of the medial end of the clavicle on the right side.  Compared to her left.  Nontender.  No rash or erythema.  Not feel a discrete mobile mass. Cardiovascular:     Comments: Over the medial end of the right clavicle it is much larger compared to her left side.  It is firm like bone.  I do not see any rash or irritation or erythema.  No fluctuance.  No discrete mass. Musculoskeletal:     Cervical back: Normal range of motion. No rigidity.  Lymphadenopathy:     Cervical: No cervical adenopathy.  Neurological:     Mental Status: She is alert.    BP (!) 142/81   Pulse 75   Ht 5' (1.524 m)   Wt 140 lb (63.5 kg)   SpO2  100%   BMI 27.34 kg/m  Wt Readings from Last 3 Encounters:  12/25/20 140 lb (63.5 kg)  07/02/20 142 lb (64.4 kg)  11/04/19 142 lb (64.4 kg)    Health Maintenance Due  Topic Date Due   Zoster Vaccines- Shingrix (1 of 2) Never done   Pneumonia Vaccine 76+ Years old (2 - PCV) 12/14/2012   COVID-19 Vaccine (2 - Moderna risk series) 11/09/2019   INFLUENZA VACCINE  09/21/2020    There are no preventive care reminders to display for this patient.   Lab Results  Component Value Date   TSH 1.82 11/04/2019   Lab Results  Component Value Date   WBC 5.7 11/04/2019   HGB 13.4 11/04/2019   HCT 41.1 11/04/2019   MCV 89.7 11/04/2019   PLT 194 11/04/2019   Lab Results  Component Value Date   NA 138 11/04/2019   K 4.0 11/04/2019   CO2 27 11/04/2019   GLUCOSE 82 11/04/2019   BUN 15 11/04/2019   CREATININE 0.82 11/04/2019   BILITOT 0.7 11/04/2019   ALKPHOS 71 10/30/2015   AST 17 11/04/2019   ALT 10 11/04/2019   PROT 6.9 11/04/2019   ALBUMIN 4.3 10/30/2015   CALCIUM 9.0 11/04/2019   Lab Results  Component Value Date   CHOL 175 11/04/2019   Lab Results  Component Value Date   HDL 92 11/04/2019   Lab Results  Component Value Date   LDLCALC 66 11/04/2019   Lab Results  Component Value Date   TRIG 85 11/04/2019   Lab Results  Component Value Date   CHOLHDL 1.9 11/04/2019   No results found for: HGBA1C     Assessment & Plan:   Problem List Items Addressed This Visit   None Visit Diagnoses     Localized superficial swelling, mass, or lump    -  Primary   Relevant Orders   DG Clavicle Right   Acute cough          I really think that this is just extra calcium deposit on the bone from arthritis at the joint.  Will get a plain film x-ray just for confirmation and to make sure that we do not see anything abnormal such as lytic lesion etc.  Coughing/choking with rotation of the head-we did discuss that since its been going on for  10 years and its not really worse  it is most likely something anatomical.  I am not concerned about a growing mass etc.  But certainly if at any point she feels like it is becoming more frequent to please let me know and we can work it up further.  No orders of the defined types were placed in this encounter.    Beatrice Lecher, MD

## 2020-12-28 ENCOUNTER — Other Ambulatory Visit: Payer: Self-pay

## 2020-12-28 ENCOUNTER — Ambulatory Visit (INDEPENDENT_AMBULATORY_CARE_PROVIDER_SITE_OTHER): Payer: Medicare Other

## 2020-12-28 DIAGNOSIS — R222 Localized swelling, mass and lump, trunk: Secondary | ICD-10-CM | POA: Diagnosis not present

## 2020-12-28 DIAGNOSIS — R2231 Localized swelling, mass and lump, right upper limb: Secondary | ICD-10-CM | POA: Diagnosis not present

## 2020-12-28 DIAGNOSIS — R229 Localized swelling, mass and lump, unspecified: Secondary | ICD-10-CM

## 2020-12-28 NOTE — Addendum Note (Signed)
Addended by: Beatrice Lecher D on: 12/28/2020 09:22 AM   Modules accepted: Orders

## 2020-12-30 IMAGING — US RIGHT LOWER EXTREMITY VENOUS ULTRASOUND
1 series · 13 of 24 positions shown · non-contrast
Comparison: None.

CLINICAL DATA: 77-year-old female with persistent right lower
extremity pain after falling on May 20, 2018.



[Series 1: right lower extremity venous ultrasound · 0.08mm/px · 13 of 28 slices shown]
[im 1/28]
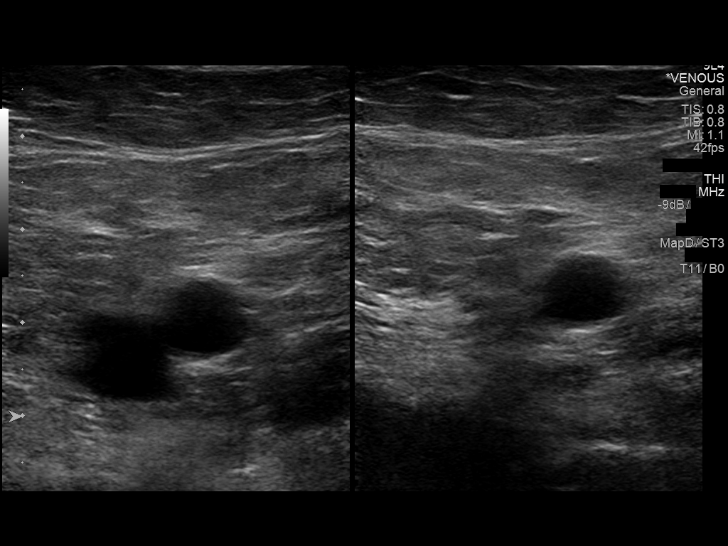
[im 3/28]
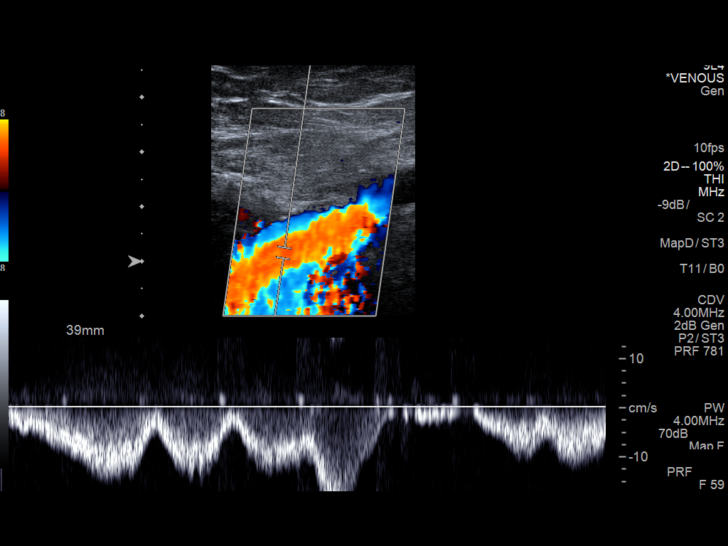
[im 5/28]
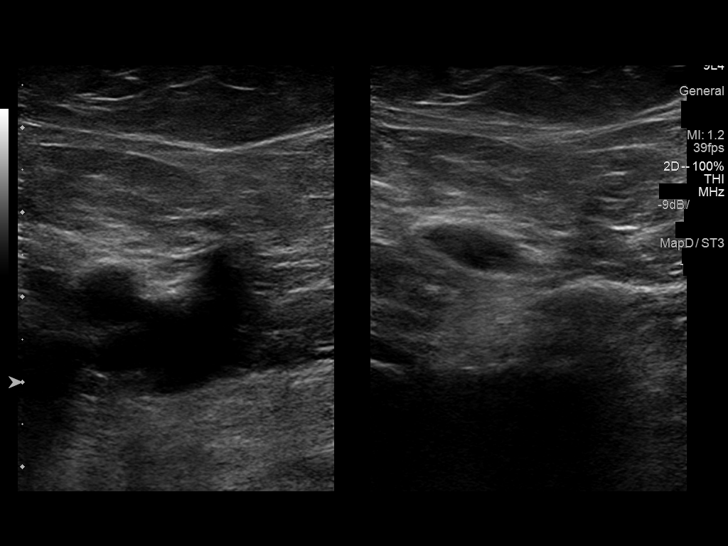
[im 8/28]
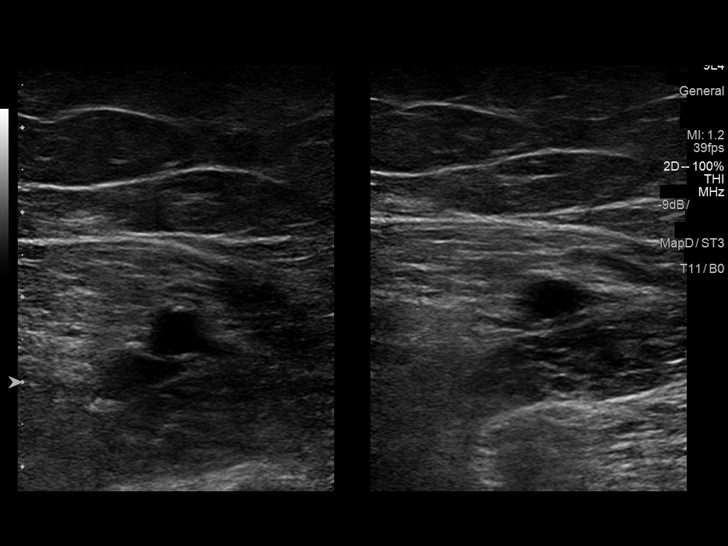
[im 10/28]
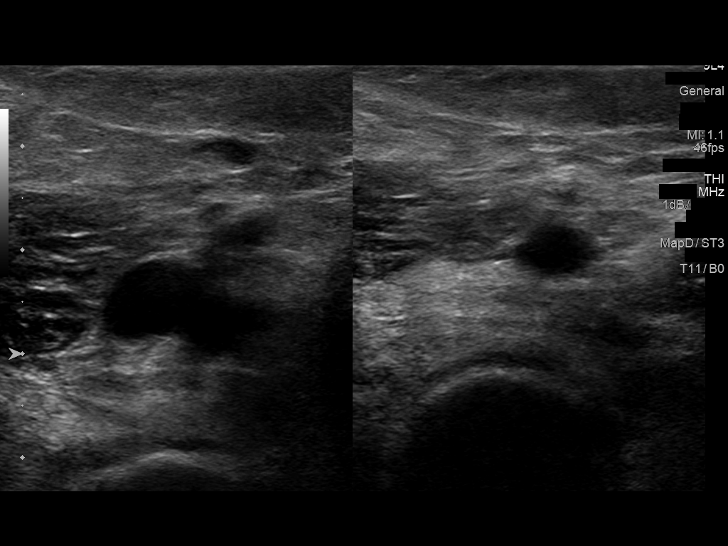
[im 12/28]
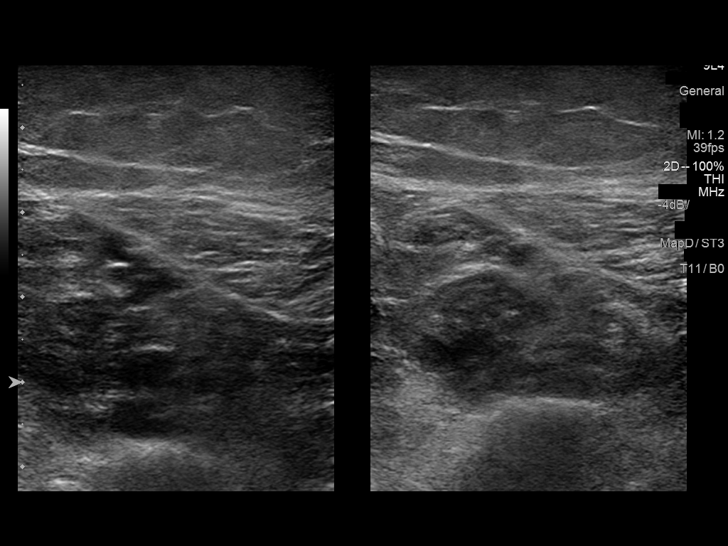
[im 15/28]
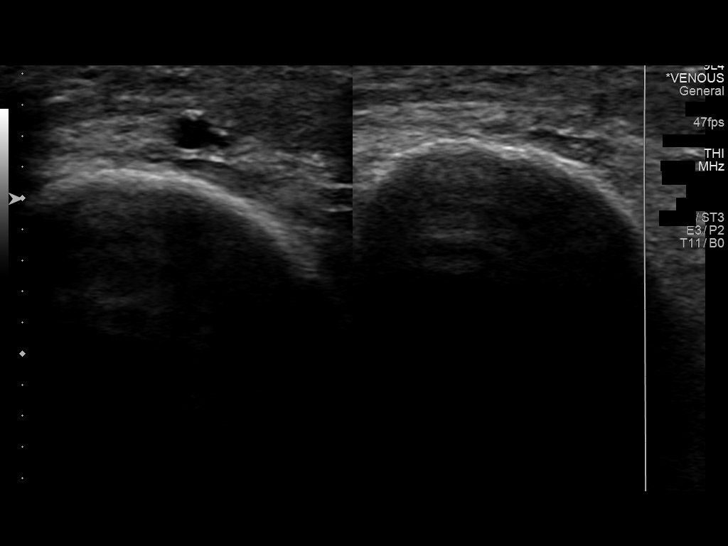
[im 16/28]
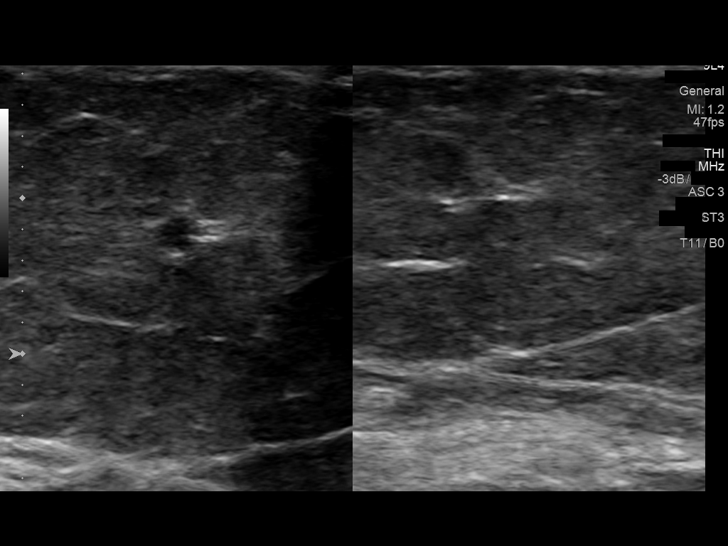
[im 18/28]
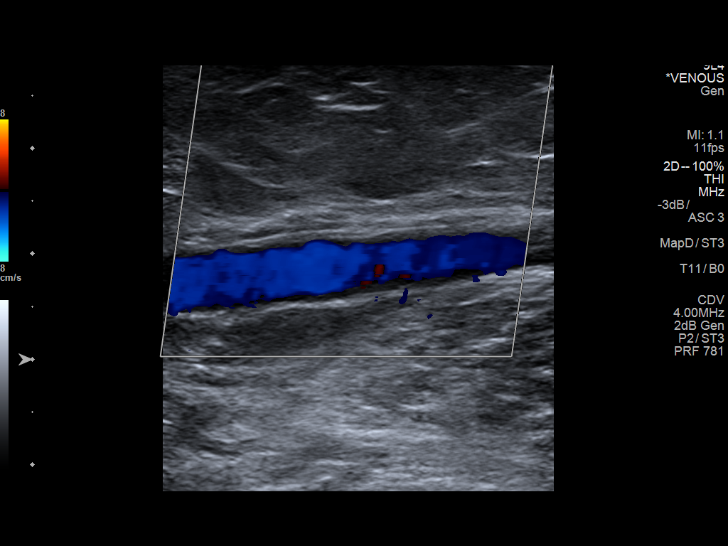
[im 20/28]
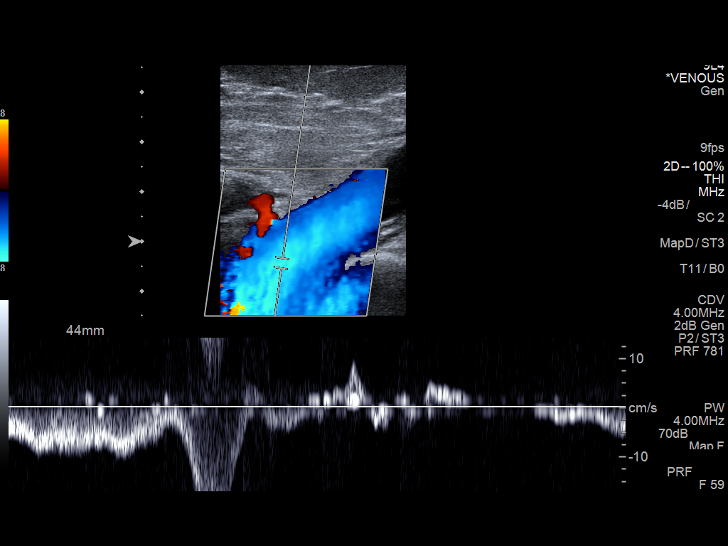
[im 23/28]
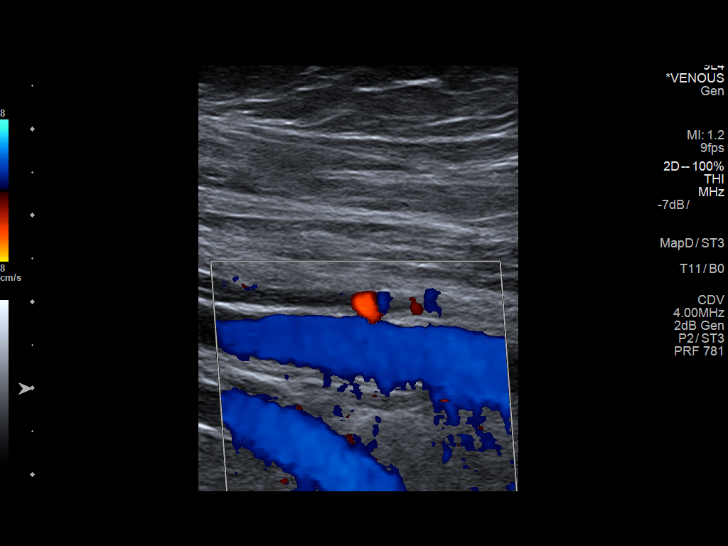
[im 25/28]
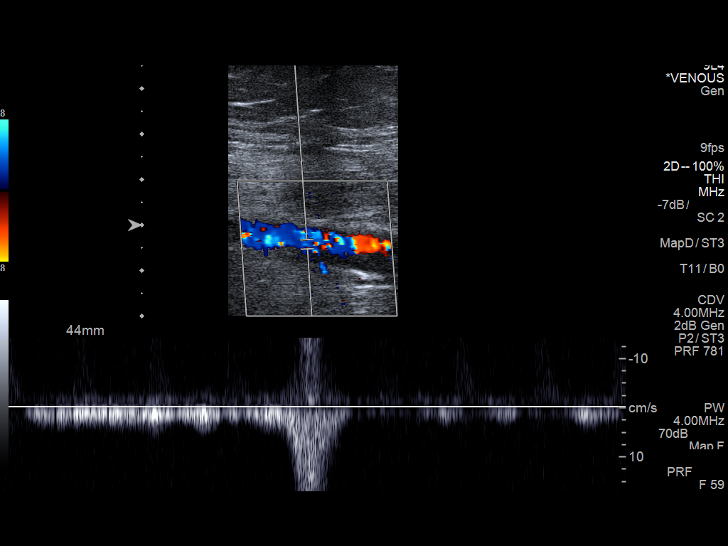
[im 28/28]
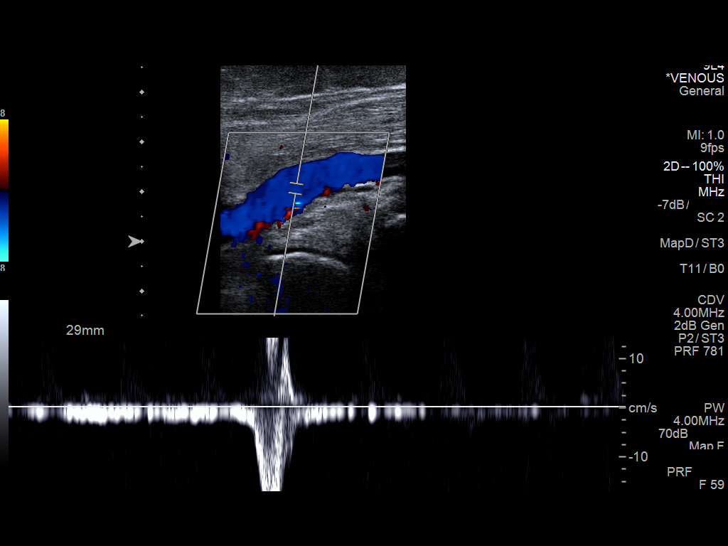

[13 of 24 positions shown; findings below may reference images not displayed]

FINDINGS: Contralateral Common Femoral Vein: Respiratory phasicity is normal
and symmetric with the symptomatic side. No evidence of thrombus.
Normal compressibility.

Common Femoral Vein: No evidence of thrombus. Normal
compressibility, respiratory phasicity and response to augmentation.

Saphenofemoral Junction: No evidence of thrombus. Normal
compressibility and flow on color Doppler imaging.

Profunda Femoral Vein: No evidence of thrombus. Normal
compressibility and flow on color Doppler imaging.

Femoral Vein: No evidence of thrombus. Normal compressibility,
respiratory phasicity and response to augmentation.

Popliteal Vein: No evidence of thrombus. Normal compressibility,
respiratory phasicity and response to augmentation.

Calf Veins: No evidence of thrombus. Normal compressibility and flow
on color Doppler imaging.

Superficial Great Saphenous Vein: No evidence of thrombus. Normal
compressibility.

Venous Reflux:  None.

Other Findings:  None.
IMPRESSION: No evidence of deep venous thrombosis.

## 2020-12-30 NOTE — Progress Notes (Signed)
Call patient: They did not see anything worrisome on the x-ray of the clavicle.  No bone lesions or fracture.  No erosion of the bone.  Recommended that we consider an MRI for further work-up if were concerned.  I think we could be fine to monitor it unless you feel like it is changing or becoming painful.

## 2021-01-08 IMAGING — DX RIGHT KNEE - COMPLETE 4+ VIEW
4 series · 4 of 4 positions shown · non-contrast
Comparison: 07/02/2018.
COMPARISON: 07/02/2018.

Addendum:
CLINICAL DATA: Fall.

EXAM:
RIGHT KNEE - COMPLETE 4+ VIEW

[knee lat]
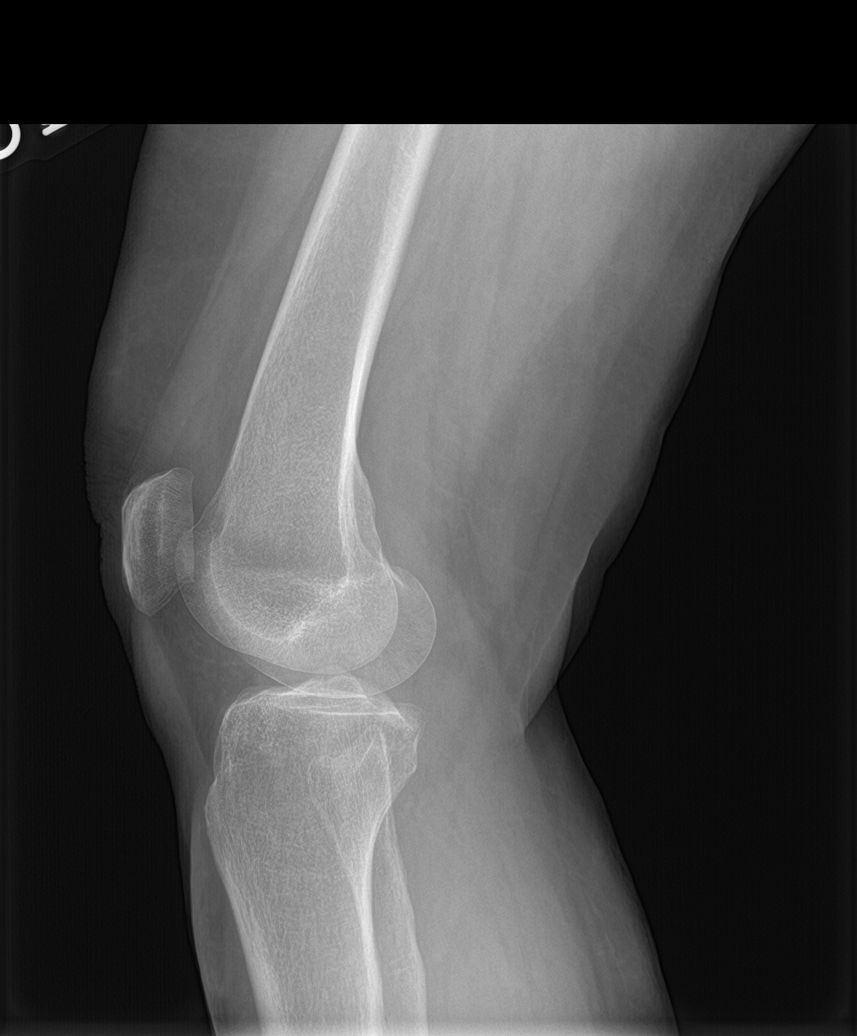

[knee sunrise]
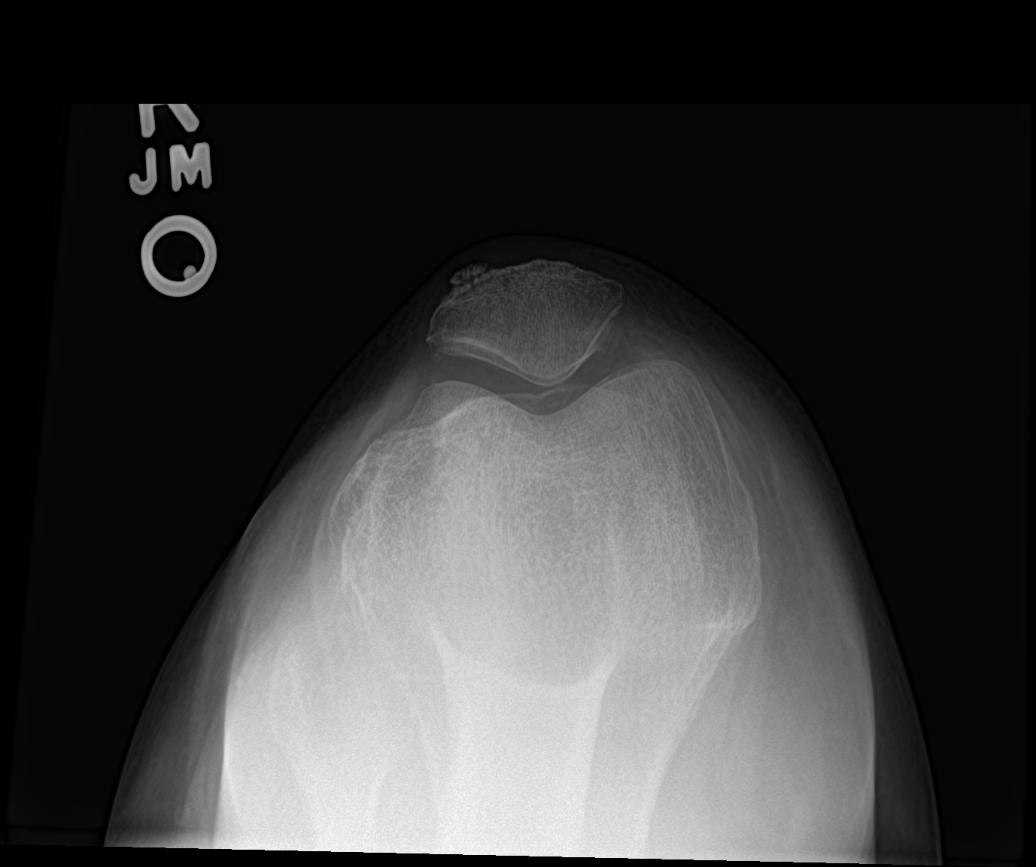

[knee ap bilat standing (1 of 2)]
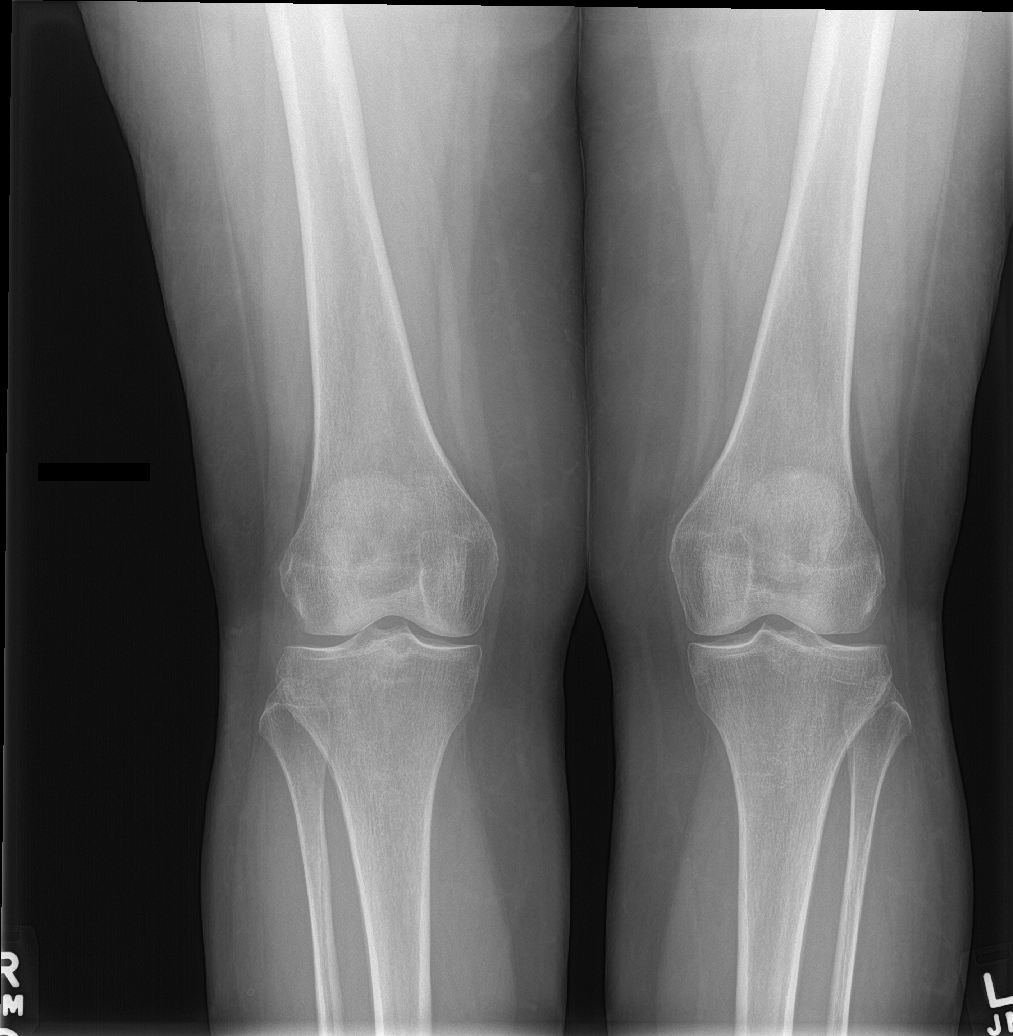

[knee ap bilat standing (2 of 2)]
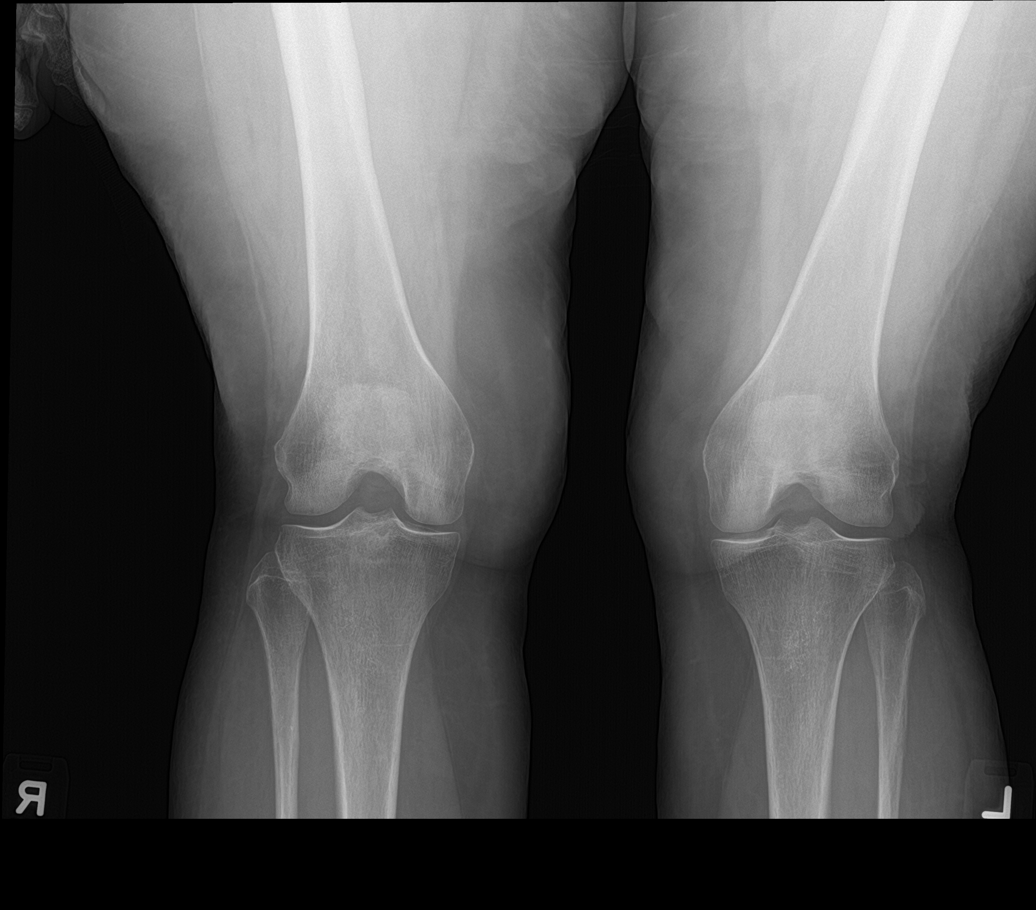

[4 of 4 positions shown; findings below may reference images not displayed]

FINDINGS: No acute bony or joint abnormality. No evidence of fracture or
dislocation. Mild patellofemoral and medial compartment degenerative
change. No significant effusion.
IMPRESSION: 1.  Mild patellofemoral medial compartment degenerative change.

2.  No acute abnormality.  No evidence of fracture.

ADDENDUM:
Previously identified sclerotic density in the proximal right tibia
is not identified on today's exam. This region appears to be
unremarkable on today's exam.

*** End of Addendum ***
FINDINGS: No acute bony or joint abnormality. No evidence of fracture or
dislocation. Mild patellofemoral and medial compartment degenerative
change. No significant effusion.
IMPRESSION: 1.  Mild patellofemoral medial compartment degenerative change.

2.  No acute abnormality.  No evidence of fracture.

## 2021-05-19 ENCOUNTER — Telehealth: Payer: Self-pay | Admitting: Family Medicine

## 2021-05-19 NOTE — Telephone Encounter (Signed)
Needs appt. Doesn't sounds urgent so can schedule for when I am back ?

## 2021-05-19 NOTE — Telephone Encounter (Signed)
Patient called to get an appointment with Dr. Madilyn Fireman because she has been very tired lately. She wanted to get blood work done to see if she has low iron. Since no appointments are available patient asked if Dr. Madilyn Fireman would order blood work so she can have her iron tested. Also told patient she could go to urgent care. Please advise. ?

## 2021-05-20 NOTE — Telephone Encounter (Signed)
Patient advised and scheduled.  

## 2021-06-04 ENCOUNTER — Encounter: Payer: Self-pay | Admitting: Family Medicine

## 2021-06-04 ENCOUNTER — Ambulatory Visit (INDEPENDENT_AMBULATORY_CARE_PROVIDER_SITE_OTHER): Payer: Medicare Other | Admitting: Family Medicine

## 2021-06-04 VITALS — BP 159/66 | HR 77 | Resp 18 | Ht 60.0 in | Wt 137.0 lb

## 2021-06-04 DIAGNOSIS — R5383 Other fatigue: Secondary | ICD-10-CM | POA: Diagnosis not present

## 2021-06-04 DIAGNOSIS — E559 Vitamin D deficiency, unspecified: Secondary | ICD-10-CM | POA: Diagnosis not present

## 2021-06-04 DIAGNOSIS — E618 Deficiency of other specified nutrient elements: Secondary | ICD-10-CM

## 2021-06-04 DIAGNOSIS — R030 Elevated blood-pressure reading, without diagnosis of hypertension: Secondary | ICD-10-CM | POA: Diagnosis not present

## 2021-06-04 DIAGNOSIS — E611 Iron deficiency: Secondary | ICD-10-CM | POA: Diagnosis not present

## 2021-06-04 NOTE — Assessment & Plan Note (Signed)
Plan to recheck.

## 2021-06-04 NOTE — Progress Notes (Signed)
? ?Acute Office Visit ? ?Subjective:  ? ? Patient ID: Alexis Small, female    DOB: [birth year], 81 y.o.   MRN: [id number] ? ?Chief Complaint  ?Patient presents with  ? Fatigue  ?  Patient stated she has been fatigue and sleeping a lot for 1 month.   ? ? ?HPI ?Patient is in today for fatigue x 1 month. She has been sleeping a lot.  She said sometimes she just does not want to get out of bed in the mornings but it also just feels very comfortable to her.  She stays very active helping to take care of her young grandson.  She says sometimes she just feels like she has to lay down and rest but she does not nap.  She says her whole body just will feel "heavy".  She denies any night sweats or fevers no blood in the urine or stool.  No abnormally swollen lymph nodes.  No unusual rashes.  No recent medication changes she does take 14 different supplements.  She has not had any chest pain with activity.  She often walks about 2 miles per day she also helps walk her granddaughter's dog.  She wonders if she could have low iron.     ? ?Did have an episode about a week ago where she had been sitting playing a game with her family and stood up and just felt a little lightheaded.  Her daughter had her lay down in bed and then checked her blood pressure blood pressure was good at that point but was she was laying in bed. ? ?He often eats irregularly.  She says overall she feels like she eats healthy choices.  She is not vegetarian she does eat some meats.  But sometimes will not eat until 4:00 in the afternoon. ? ?Past Medical History:  ?Diagnosis Date  ? Osteopenia 03/05/2008  ? Qualifier: Diagnosis of  By: Madilyn Fireman MD, Barnetta Chapel    ? Shingles   ? ? ?Past Surgical History:  ?Procedure Laterality Date  ? ABDOMINAL HYSTERECTOMY  1970's  ? Complete, 2x  ? APPENDECTOMY  1970's  ? BREAST SURGERY  1980's  ? reduction  ? CORNEAL TRANSPLANT  11/2014  ? [place]  ? ? ?Family History  ?Problem Relation Age of Onset  ? Aneurysm  Mother 33  ?     cerebral  ? Coronary artery disease Sister 28  ?     deceased age 48  ? COPD Sister   ?     smoker  ? Lung cancer Sister   ? Birth defects Neg Hx   ? ? ?Social History  ? ?Socioeconomic History  ? Marital status: Widowed  ?  Spouse name: Not on file  ? Number of children: 3  ? Years of education: Not on file  ? Highest education level: Not on file  ?Occupational History  ?  Employer: [employer]  ?Tobacco Use  ? Smoking status: Never  ? Smokeless tobacco: Never  ?Substance and Sexual Activity  ? Alcohol use: No  ? Drug use: No  ? Sexual activity: Not on file  ?  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  ?Other Topics Concern  ? Not on file  ?Social History Narrative  ? Walking for exercise.  No daily caffeine.   ? ?Social Determinants of Health  ? ?Financial Resource Strain: Not on file  ?Food Insecurity: Not on file  ?Transportation Needs: Not on file  ?Physical  Activity: Not on file  ?Stress: Not on file  ?Social Connections: Not on file  ?Intimate Partner Violence: Not on file  ? ? ?Outpatient Medications Prior to Visit  ?Medication Sig Dispense Refill  ? AMBULATORY NON FORMULARY MEDICATION Take 2 tablets by mouth at bedtime. Medication Name: SKELATAL STRENGTH    ? Ascorbic Acid (VITAMIN C) 100 MG tablet Take 100 mg by mouth daily.    ? BORON PO Take by mouth daily.    ? Calcium Carbonate-Vitamin D 600-200 MG-UNIT TABS Take 1 tablet by mouth at bedtime.    ? docusate sodium (COLACE) 100 MG capsule Take 100 mg by mouth daily as needed for mild constipation.    ? Menaquinone-7 (VITAMIN K2 PO) Take by mouth daily.    ? Multiple Vitamins-Minerals (ANTIOXIDANT PO) Take by mouth daily.    ? Multiple Vitamins-Minerals (ZINC PO) Take by mouth.    ? Niacin (VITAMIN B-3 PO) Take by mouth. Niacinmide    ? Omega 3-6-9 Fatty Acids (OMEGA 3-6-9 COMPLEX PO) Take by mouth.    ? Probiotic Product (PROBIOTIC-10 PO) Take by mouth daily.    ? pyridoxine (B-6) 100 MG  tablet Take 100 mg by mouth daily.    ? Specialty Vitamins Products (ONE-A-DAY BONE STRENGTH PO) Take by mouth daily. Skeletal Stength    ? Turmeric 400 MG CAPS Take by mouth daily.    ? vitamin B-12 (CYANOCOBALAMIN) 50 MCG tablet Take 50 mcg by mouth daily.    ? ?No facility-administered medications prior to visit.  ? ? ?Allergies  ?Allergen Reactions  ? Oxycodone-Acetaminophen   ?  Other reaction(s): Other (See Comments) ?"[place]"  ? Prednisone Other (See Comments)  ?  Sweats, chest pain, etc  ? Sulfamethoxazole   ?  Other reaction(s): Other (See Comments) ?unknown  ? ? ?Review of Systems ? ?   ?Objective:  ?  ?Physical Exam ?Constitutional:   ?   Appearance: She is well-developed.  ?HENT:  ?   Head: Normocephalic and atraumatic.  ?   Right Ear: External ear normal.  ?   Left Ear: External ear normal.  ?   Nose: Nose normal.  ?Eyes:  ?   Conjunctiva/sclera: Conjunctivae normal.  ?   Pupils: Pupils are equal, round, and reactive to light.  ?Neck:  ?   Thyroid: No thyromegaly.  ?Cardiovascular:  ?   Rate and Rhythm: Normal rate and regular rhythm.  ?   Heart sounds: Normal heart sounds.  ?Pulmonary:  ?   Effort: Pulmonary effort is normal.  ?   Breath sounds: Normal breath sounds. No wheezing.  ?Abdominal:  ?   General: Abdomen is flat. Bowel sounds are normal.  ?   Palpations: Abdomen is soft.  ?Musculoskeletal:  ?   Cervical back: Neck supple.  ?Lymphadenopathy:  ?   Cervical: No cervical adenopathy.  ?Skin: ?   General: Skin is warm and dry.  ?Neurological:  ?   Mental Status: She is alert and oriented to person, place, and time.  ?Psychiatric:     ?   Mood and Affect: Mood normal.     ?   Behavior: Behavior normal.  ? ? ?BP (!) 159/66   Pulse 77   Resp 18   Ht 5' (1.524 m)   Wt 137 lb (62.1 kg)   SpO2 97%   BMI 26.76 kg/m?  ?Wt Readings from Last 3 Encounters:  ?06/04/21 137 lb (62.1 kg)  ?12/25/20 140 lb (63.5 kg)  ?07/02/20 142  lb (64.4 kg)  ? ? ?Health Maintenance Due  ?Topic Date Due  ? Zoster  Vaccines- Shingrix (1 of 2) Never done  ? COVID-19 Vaccine (2 - Moderna risk series) 11/09/2019  ? ? ?There are no preventive care reminders to display for this patient. ? ? ?Lab Results  ?Component Value Date  ? TSH 1.82 11/04/2019  ? ?Lab Results  ?Component Value Date  ? WBC 5.7 11/04/2019  ? HGB 13.4 11/04/2019  ? HCT 41.1 11/04/2019  ? MCV 89.7 11/04/2019  ? PLT 194 11/04/2019  ? ?Lab Results  ?Component Value Date  ? NA 138 11/04/2019  ? K 4.0 11/04/2019  ? CO2 27 11/04/2019  ? GLUCOSE 82 11/04/2019  ? BUN 15 11/04/2019  ? CREATININE 0.82 11/04/2019  ? BILITOT 0.7 11/04/2019  ? ALKPHOS 71 10/30/2015  ? AST 17 11/04/2019  ? ALT 10 11/04/2019  ? PROT 6.9 11/04/2019  ? ALBUMIN 4.3 10/30/2015  ? CALCIUM 9.0 11/04/2019  ? ?Lab Results  ?Component Value Date  ? CHOL 175 11/04/2019  ? ?Lab Results  ?Component Value Date  ? HDL 92 11/04/2019  ? ?Lab Results  ?Component Value Date  ? [place] 66 11/04/2019  ? ?Lab Results  ?Component Value Date  ? TRIG 85 11/04/2019  ? ?Lab Results  ?Component Value Date  ? CHOLHDL 1.9 11/04/2019  ? ?No results found for: HGBA1C ? ?   ?Assessment & Plan:  ? ?Problem List Items Addressed This Visit   ? ?  ? Other  ? Vitamin D deficiency  ?  Plan to recheck  ?  ?  ? Relevant Orders  ? VITAMIN D 25 Hydroxy (Vit-D Deficiency, Fractures)  ? Urinalysis, Routine w reflex microscopic  ? ?Other Visit Diagnoses   ? ? Other fatigue    -  Primary  ? Relevant Orders  ? Fe+TIBC+Fer  ? CBC  ? TSH  ? VITAMIN D 25 Hydroxy (Vit-D Deficiency, Fractures)  ? B12  ? Vitamin B1  ? COMPLETE METABOLIC PANEL WITH GFR  ? Urinalysis, Routine w reflex microscopic  ? Mineral deficiency      ? Relevant Orders  ? Fe+TIBC+Fer  ? CBC  ? VITAMIN D 25 Hydroxy (Vit-D Deficiency, Fractures)  ? B12  ? Vitamin B1  ? Urinalysis, Routine w reflex microscopic  ? Elevated BP without diagnosis of hypertension      ? ?  ? ?Fatigue-unclear etiology.  She does not have any red flag symptoms no sign of blood loss or what ever that  could be causing anemia but will check a CBC as well as iron and B12 and B1.  She says she does eat some meats.  But says she does not have the best dietary habits so did encourage her to really work on trying to e

## 2021-06-08 ENCOUNTER — Encounter: Payer: Self-pay | Admitting: Family Medicine

## 2021-06-08 DIAGNOSIS — E611 Iron deficiency: Secondary | ICD-10-CM | POA: Insufficient documentation

## 2021-06-08 NOTE — Progress Notes (Signed)
Hi Aspyn, your iron is low.  Have you noticed any blood in your urine or stool?  I would like you to do some stool cards if you can come by and pick those up just to make sure that there is not any microscopic amounts of blood in your stool.  I would recommend for you to start an over-the-counter iron supplement.  Please start taking 1 a day and then we can recheck your iron panel again in about 8 to 12 weeks.   ? ?No bacteria in your urine sample.  Blood count looks good.  Thyroid looks great.  Vitamin D also looks good at 40.  Continue to take your vitamin D supplement.  B12 also looks great.  Metabolic panel including liver and kidney function is stable.  Vitamin B1 is still pending.

## 2021-06-11 LAB — COMPLETE METABOLIC PANEL WITH GFR
AG Ratio: 1.3 (calc) (ref 1.0–2.5)
ALT: 14 U/L (ref 6–29)
AST: 15 U/L (ref 10–35)
Albumin: 4.1 g/dL (ref 3.6–5.1)
Alkaline phosphatase (APISO): 78 U/L (ref 37–153)
BUN: 25 mg/dL (ref 7–25)
CO2: 29 mmol/L (ref 20–32)
Calcium: 10 mg/dL (ref 8.6–10.4)
Chloride: 106 mmol/L (ref 98–110)
Creat: 0.76 mg/dL (ref 0.60–0.95)
Globulin: 3.1 g/dL (calc) (ref 1.9–3.7)
Glucose, Bld: 84 mg/dL (ref 65–99)
Potassium: 4.5 mmol/L (ref 3.5–5.3)
Sodium: 143 mmol/L (ref 135–146)
Total Bilirubin: 0.4 mg/dL (ref 0.2–1.2)
Total Protein: 7.2 g/dL (ref 6.1–8.1)
eGFR: 79 mL/min/{1.73_m2} (ref >=60)

## 2021-06-11 LAB — URINALYSIS, ROUTINE W REFLEX MICROSCOPIC
Bacteria, UA: NONE SEEN /HPF
Bilirubin Urine: NEGATIVE
Glucose, UA: NEGATIVE
Hgb urine dipstick: NEGATIVE
Hyaline Cast: NONE SEEN /LPF
Ketones, ur: NEGATIVE
Nitrite: NEGATIVE
Protein, ur: NEGATIVE
Specific Gravity, Urine: 1.024 (ref 1.001–1.035)
pH: 5.5 (ref 5.0–8.0)

## 2021-06-11 LAB — IRON,TIBC AND FERRITIN PANEL
%SAT: 13 % (calc) — ABNORMAL LOW (ref 16–45)
Ferritin: 62 ng/mL (ref 16–288)
Iron: 41 ug/dL — ABNORMAL LOW (ref 45–160)
TIBC: 308 mcg/dL (calc) (ref 250–450)

## 2021-06-11 LAB — VITAMIN B12: Vitamin B-12: 449 pg/mL (ref 200–1100)

## 2021-06-11 LAB — CBC
HCT: 39.2 % (ref 35.0–45.0)
Hemoglobin: 13.1 g/dL (ref 11.7–15.5)
MCH: 29.4 pg (ref 27.0–33.0)
MCHC: 33.4 g/dL (ref 32.0–36.0)
MCV: 88.1 fL (ref 80.0–100.0)
MPV: 10.9 fL (ref 7.5–12.5)
Platelets: 192 10*3/uL (ref 140–400)
RBC: 4.45 10*6/uL (ref 3.80–5.10)
RDW: 13.1 % (ref 11.0–15.0)
WBC: 7.2 10*3/uL (ref 3.8–10.8)

## 2021-06-11 LAB — VITAMIN B1: Vitamin B1 (Thiamine): 12 nmol/L (ref 8–30)

## 2021-06-11 LAB — MICROSCOPIC MESSAGE

## 2021-06-11 LAB — VITAMIN D 25 HYDROXY (VIT D DEFICIENCY, FRACTURES): Vit D, 25-Hydroxy: 40 ng/mL (ref 30–100)

## 2021-06-11 LAB — TSH: TSH: 1.63 mIU/L (ref 0.40–4.50)

## 2021-06-15 ENCOUNTER — Other Ambulatory Visit: Payer: Self-pay | Admitting: *Deleted

## 2021-06-15 DIAGNOSIS — Z1211 Encounter for screening for malignant neoplasm of colon: Secondary | ICD-10-CM

## 2021-06-15 LAB — POC HEMOCCULT BLD/STL (HOME/3-CARD/SCREEN)
Card #2 Fecal Occult Blod, POC: NEGATIVE
Card #3 Fecal Occult Blood, POC: NEGATIVE
Fecal Occult Blood, POC: NEGATIVE

## 2021-06-15 NOTE — Progress Notes (Signed)
Samples are negative.

## 2021-06-15 NOTE — Progress Notes (Signed)
Call patient: Vitamin B1 looks good.

## 2021-06-18 ENCOUNTER — Ambulatory Visit (INDEPENDENT_AMBULATORY_CARE_PROVIDER_SITE_OTHER): Payer: Medicare Other | Admitting: Family Medicine

## 2021-06-18 DIAGNOSIS — R030 Elevated blood-pressure reading, without diagnosis of hypertension: Secondary | ICD-10-CM | POA: Diagnosis not present

## 2021-06-18 NOTE — Progress Notes (Signed)
HTN - Well controlled. Continue current regimen. Keep f/u appt ? ?

## 2021-06-18 NOTE — Progress Notes (Signed)
Pt presents to clinic today for BP check. Denies any cp/sob/palpitaions/headaches/dizziness or swelling.    

## 2021-08-02 DIAGNOSIS — S52571A Other intraarticular fracture of lower end of right radius, initial encounter for closed fracture: Secondary | ICD-10-CM | POA: Diagnosis not present

## 2021-08-02 DIAGNOSIS — Z885 Allergy status to narcotic agent status: Secondary | ICD-10-CM | POA: Diagnosis not present

## 2021-08-02 DIAGNOSIS — S52601A Unspecified fracture of lower end of right ulna, initial encounter for closed fracture: Secondary | ICD-10-CM | POA: Diagnosis not present

## 2021-08-02 DIAGNOSIS — Z882 Allergy status to sulfonamides status: Secondary | ICD-10-CM | POA: Diagnosis not present

## 2021-08-02 DIAGNOSIS — Z888 Allergy status to other drugs, medicaments and biological substances status: Secondary | ICD-10-CM | POA: Diagnosis not present

## 2021-08-02 DIAGNOSIS — S62101A Fracture of unspecified carpal bone, right wrist, initial encounter for closed fracture: Secondary | ICD-10-CM | POA: Diagnosis not present

## 2021-08-02 DIAGNOSIS — S52611A Displaced fracture of right ulna styloid process, initial encounter for closed fracture: Secondary | ICD-10-CM | POA: Diagnosis not present

## 2021-08-02 DIAGNOSIS — S52501A Unspecified fracture of the lower end of right radius, initial encounter for closed fracture: Secondary | ICD-10-CM | POA: Diagnosis not present

## 2021-08-03 ENCOUNTER — Ambulatory Visit: Payer: Medicare Other | Admitting: Family Medicine

## 2021-08-03 ENCOUNTER — Encounter: Payer: Self-pay | Admitting: Family Medicine

## 2021-08-03 ENCOUNTER — Ambulatory Visit: Payer: Self-pay

## 2021-08-03 VITALS — BP 150/70 | Ht 59.96 in | Wt 136.0 lb

## 2021-08-03 DIAGNOSIS — S52601A Unspecified fracture of lower end of right ulna, initial encounter for closed fracture: Secondary | ICD-10-CM | POA: Insufficient documentation

## 2021-08-03 DIAGNOSIS — S52531A Colles' fracture of right radius, initial encounter for closed fracture: Secondary | ICD-10-CM

## 2021-08-03 DIAGNOSIS — M8000XA Age-related osteoporosis with current pathological fracture, unspecified site, initial encounter for fracture: Secondary | ICD-10-CM

## 2021-08-03 DIAGNOSIS — S52691A Other fracture of lower end of right ulna, initial encounter for closed fracture: Secondary | ICD-10-CM

## 2021-08-03 DIAGNOSIS — S8010XA Contusion of unspecified lower leg, initial encounter: Secondary | ICD-10-CM | POA: Diagnosis not present

## 2021-08-03 DIAGNOSIS — M25562 Pain in left knee: Secondary | ICD-10-CM

## 2021-08-03 NOTE — Patient Instructions (Signed)
Good to see you Please use ice as needed  Please continue the sling and splint  I will call with the xray results when you drop them off   Prolia and evenity are the medicines used for osteoporosis  Please send me a message in MyChart with any questions or updates.  Please see me back in 7-10 days if no surgery is needed.   --Dr. Raeford Razor

## 2021-08-03 NOTE — Progress Notes (Signed)
  Alexis Small - 81 y.o. female MRN [id number]  Date of birth: [birth year]  SUBJECTIVE:  Including CC & ROS.  No chief complaint on file.   Alexis Small is a 81 y.o. female that is presenting with acute right arm fracture as well as left knee pain after a recent fall.  She had a fall and landed on her hand yesterday.  She was seen in the emergency department and placed in a sugar-tong splint.  She is also having some pain over the lateral knee.  Review of the emergency department note from 6/12 shows she was found to have a fracture of her distal radius and provided oxycodone. Review of the right wrist x-ray from 6/12 shows comminuted intra-articular fracture of the distal radius as well as a fracture through the base of the ulnar styloid.  Review of Systems See HPI   HISTORY: Past Medical, Surgical, Social, and Family History Reviewed & Updated per EMR.   Pertinent Historical Findings include:  Past Medical History:  Diagnosis Date   Osteopenia 03/05/2008   Qualifier: Diagnosis of  By: Madilyn Fireman MD, Lidia Collum     Past Surgical History:  Procedure Laterality Date   ABDOMINAL HYSTERECTOMY  1970's   Complete, 2x   APPENDECTOMY  1970's   BREAST SURGERY  1980's   reduction   CORNEAL TRANSPLANT  11/2014   [place]     PHYSICAL EXAM:  VS: BP (!) 150/70 (BP Location: Left Arm, Patient Position: Sitting)   Ht 4' 11.96" (1.523 m)   Wt 136 lb (61.7 kg)   BMI 26.60 kg/m  Physical Exam Gen: NAD, alert, cooperative with exam, well-appearing MSK:  Neurovascularly intact    Limited ultrasound: Left knee:  No effusion the suprapatellar pouch. Normal-appearing quadricep and patellar tendon. No significant changes over the medial or lateral joint space. Nonspecific hyperemia over the proximal fibula appreciated  Summary: Proximal fibular bony contusion  Ultrasound and interpretation by Clearance Coots, MD    ASSESSMENT & PLAN:   Closed fracture  of lower end of right ulna Acutely occurring.  Initial injury on 6/12.  Unable to visualize x-rays today. -Counseled on home exercise therapy and supportive care. -Continue sugar-tong splint   Contusion of fibula Acutely occurring.  Initial injury on 6/12.  She is unsure if she landed on the knee.  There are some changes at the proximal fibula to suggest a contusion versus a nondisplaced fracture. -Counseled on home exercise therapy and supportive care. -Could consider further imaging.  Fracture, Colles, right, closed Acutely occurring.  Initial injury on 6/12.  Unable to visualize x-rays today.  Fracture does report an intra-articular extension as well as a comminuted -Counseled on home exercise therapy and supportive care. -Continue sugar-tong splint. -We will bring by the CD for the visualization of the x-rays to determine if surgery is necessary.  Osteoporosis with current pathological fracture Acute on chronic in nature.  Has a history of osteoporosis with current pathological fracture. -Counseled on supportive care. -We will pursue updated bone density. -Would consider Evenity with her history of osteoporosis and pathological fractures of the right and left radius.

## 2021-08-03 NOTE — Assessment & Plan Note (Signed)
Acutely occurring.  Initial injury on 6/12.  She is unsure if she landed on the knee.  There are some changes at the proximal fibula to suggest a contusion versus a nondisplaced fracture. -Counseled on home exercise therapy and supportive care. -Could consider further imaging.

## 2021-08-03 NOTE — Assessment & Plan Note (Addendum)
Acutely occurring.  Initial injury on 6/12.  Unable to visualize x-rays today. -Counseled on home exercise therapy and supportive care. -Continue sugar-tong splint

## 2021-08-03 NOTE — Assessment & Plan Note (Signed)
Acutely occurring.  Initial injury on 6/12.  Unable to visualize x-rays today.  Fracture does report an intra-articular extension as well as a comminuted -Counseled on home exercise therapy and supportive care. -Continue sugar-tong splint. -We will bring by the CD for the visualization of the x-rays to determine if surgery is necessary.

## 2021-08-03 NOTE — Assessment & Plan Note (Signed)
Acute on chronic in nature.  Has a history of osteoporosis with current pathological fracture. -Counseled on supportive care. -We will pursue updated bone density. -Would consider Evenity with her history of osteoporosis and pathological fractures of the right and left radius.

## 2021-08-05 ENCOUNTER — Telehealth: Payer: Self-pay | Admitting: *Deleted

## 2021-08-05 NOTE — Telephone Encounter (Signed)
-----   Message from Rosemarie Ax, MD sent at 08/05/2021  2:35 PM EDT ----- Regarding: FW: Ms Flippen's images from [place] still can't see the images. The best bet would have them bring by the cd with images ----- Message ----- From: Elberta Spaniel Sent: 08/05/2021   1:38 PM EDT To: Rosemarie Ax, MD Subject: Ms Flippen's images from [place],  Pt called wanted to know if you were able to see her images through [facility] ????  Thanks,   Baker Janus

## 2021-08-05 NOTE — Telephone Encounter (Signed)
Pt informed of below.  

## 2021-08-09 ENCOUNTER — Encounter: Payer: Self-pay | Admitting: Family Medicine

## 2021-08-09 ENCOUNTER — Ambulatory Visit: Payer: Medicare Other | Admitting: Family Medicine

## 2021-08-09 ENCOUNTER — Ambulatory Visit (HOSPITAL_BASED_OUTPATIENT_CLINIC_OR_DEPARTMENT_OTHER)
Admission: RE | Admit: 2021-08-09 | Discharge: 2021-08-09 | Disposition: A | Discharge status: Home / Self Care | Payer: Medicare Other | Source: Ambulatory Visit | Attending: Family Medicine | Admitting: Family Medicine

## 2021-08-09 VITALS — BP 110/78 | Ht 59.96 in | Wt 136.0 lb

## 2021-08-09 DIAGNOSIS — S52531D Colles' fracture of right radius, subsequent encounter for closed fracture with routine healing: Secondary | ICD-10-CM

## 2021-08-09 DIAGNOSIS — S52501A Unspecified fracture of the lower end of right radius, initial encounter for closed fracture: Secondary | ICD-10-CM | POA: Diagnosis not present

## 2021-08-09 NOTE — Progress Notes (Signed)
  Alexis Small - 81 y.o. female MRN [id number]  Date of birth: [birth year]  SUBJECTIVE:  Including CC & ROS.  No chief complaint on file.   Alexis Small is a 81 y.o. female that is following up for her right wrist fracture.  She has been immobilized in a sugar-tong splint.  She denies any worsening pain and no numbness of her fingers.    Review of Systems See HPI   HISTORY: Past Medical, Surgical, Social, and Family History Reviewed & Updated per EMR.   Pertinent Historical Findings include:  Past Medical History:  Diagnosis Date   Osteopenia 03/05/2008   Qualifier: Diagnosis of  By: Madilyn Fireman MD, Lidia Collum     Past Surgical History:  Procedure Laterality Date   ABDOMINAL HYSTERECTOMY  1970's   Complete, 2x   APPENDECTOMY  1970's   BREAST SURGERY  1980's   reduction   CORNEAL TRANSPLANT  11/2014   [place]     PHYSICAL EXAM:  VS: BP 110/78 (BP Location: Left Arm, Patient Position: Sitting)   Ht 4' 11.96" (1.523 m)   Wt 136 lb (61.7 kg)   BMI 26.60 kg/m  Physical Exam Gen: NAD, alert, cooperative with exam, well-appearing MSK:  Neurovascularly intact       ASSESSMENT & PLAN:   Fracture, Colles, right, closed Acutely occurring.  Initial injury on 6/12.  Independent review of the x-ray today shows a impacted fracture of the distal radius with no significant displacement -Counseled on home exercise therapy and supportive care. -Placed in Exos cast. -Follow-up in 2 weeks.

## 2021-08-09 NOTE — Assessment & Plan Note (Signed)
Acutely occurring.  Initial injury on 6/12.  Independent review of the x-ray today shows a impacted fracture of the distal radius with no significant displacement -Counseled on home exercise therapy and supportive care. -Placed in Exos cast. -Follow-up in 2 weeks.

## 2021-08-30 ENCOUNTER — Ambulatory Visit (HOSPITAL_BASED_OUTPATIENT_CLINIC_OR_DEPARTMENT_OTHER)
Admission: RE | Admit: 2021-08-30 | Discharge: 2021-08-30 | Disposition: A | Discharge status: Home / Self Care | Payer: Medicare Other | Source: Ambulatory Visit | Attending: Family Medicine | Admitting: Family Medicine

## 2021-08-30 ENCOUNTER — Ambulatory Visit: Payer: Medicare Other | Admitting: Family Medicine

## 2021-08-30 ENCOUNTER — Encounter: Payer: Self-pay | Admitting: Family Medicine

## 2021-08-30 VITALS — BP 130/80 | Ht 59.0 in | Wt 136.0 lb

## 2021-08-30 DIAGNOSIS — S52521D Torus fracture of lower end of right radius, subsequent encounter for fracture with routine healing: Secondary | ICD-10-CM | POA: Diagnosis not present

## 2021-08-30 DIAGNOSIS — S52691D Other fracture of lower end of right ulna, subsequent encounter for closed fracture with routine healing: Secondary | ICD-10-CM | POA: Insufficient documentation

## 2021-08-30 DIAGNOSIS — S52531D Colles' fracture of right radius, subsequent encounter for closed fracture with routine healing: Secondary | ICD-10-CM | POA: Diagnosis not present

## 2021-08-30 DIAGNOSIS — S59291A Other physeal fracture of lower end of radius, right arm, initial encounter for closed fracture: Secondary | ICD-10-CM | POA: Diagnosis not present

## 2021-08-30 NOTE — Assessment & Plan Note (Signed)
Initial injury on 6/12.  Having more swelling over the ulnar aspect of the wrist. -Counseled on home exercise therapy and supportive care. -X-ray. -Could consider occupational therapy.

## 2021-08-30 NOTE — Assessment & Plan Note (Signed)
Initial injury on 6/12.  No significant swelling today.  Has good passive range of motion. -Counseled on home exercise therapy and supportive care. -Counseled on using Velcro brace. -X-ray.

## 2021-08-30 NOTE — Patient Instructions (Signed)
Good to see you Please have the Velcro brace. You can continue to use the cast if you are having pain. I will call with the x-rays results from today. Please send me a message in MyChart with any questions or updates.  Please see me back in 3 weeks.   --Dr. Raeford Razor

## 2021-08-30 NOTE — Progress Notes (Signed)
  Alexis Small - 81 y.o. female MRN [id number]  Date of birth: 23-Feb-1940  SUBJECTIVE:  Including CC & ROS.  No chief complaint on file.   Alexis Small is a 81 y.o. female that is following up for her right wrist fracture.  Having mild pain.  No significant swelling today.   Review of Systems See HPI   HISTORY: Past Medical, Surgical, Social, and Family History Reviewed & Updated per EMR.   Pertinent Historical Findings include:  Past Medical History:  Diagnosis Date   Osteopenia 03/05/2008   Qualifier: Diagnosis of  By: Madilyn Fireman MD, Lidia Collum     Past Surgical History:  Procedure Laterality Date   ABDOMINAL HYSTERECTOMY  1970's   Complete, 2x   APPENDECTOMY  1970's   BREAST SURGERY  1980's   reduction   CORNEAL TRANSPLANT  11/2014   [place]     PHYSICAL EXAM:  VS: BP 130/80 (BP Location: Left Arm, Patient Position: Sitting)   Ht '4\' 11"'$  (1.499 m)   Wt 136 lb (61.7 kg)   BMI 27.47 kg/m  Physical Exam Gen: NAD, alert, cooperative with exam, well-appearing MSK:  Neurovascularly intact       ASSESSMENT & PLAN:   Fracture, Colles, right, closed Initial injury on 6/12.  No significant swelling today.  Has good passive range of motion. -Counseled on home exercise therapy and supportive care. -Counseled on using Velcro brace. -X-ray.  Closed fracture of lower end of right ulna Initial injury on 6/12.  Having more swelling over the ulnar aspect of the wrist. -Counseled on home exercise therapy and supportive care. -X-ray. -Could consider occupational therapy.

## 2021-08-31 ENCOUNTER — Telehealth: Payer: Self-pay | Admitting: Family Medicine

## 2021-08-31 DIAGNOSIS — Z947 Corneal transplant status: Secondary | ICD-10-CM | POA: Diagnosis not present

## 2021-08-31 NOTE — Telephone Encounter (Signed)
Left VM for patient. If she calls back please have her speak with a nurse/CMA and inform that her xray is showing healing. Continue with brace/cast and follow up as scheduled.   If any questions then please take the best time and phone number to call and I will try to call her back.   Rosemarie Ax, MD [facility] 08/31/2021, 5:33 PM

## 2021-09-02 NOTE — Telephone Encounter (Signed)
Pt informed of below.  

## 2021-09-20 ENCOUNTER — Ambulatory Visit (HOSPITAL_BASED_OUTPATIENT_CLINIC_OR_DEPARTMENT_OTHER)
Admission: RE | Admit: 2021-09-20 | Discharge: 2021-09-20 | Disposition: A | Discharge status: Home / Self Care | Payer: Medicare Other | Source: Ambulatory Visit | Attending: Family Medicine | Admitting: Family Medicine

## 2021-09-20 ENCOUNTER — Ambulatory Visit: Payer: Medicare Other | Admitting: Family Medicine

## 2021-09-20 ENCOUNTER — Encounter: Payer: Self-pay | Admitting: Family Medicine

## 2021-09-20 VITALS — BP 110/70 | Ht 59.0 in | Wt 136.0 lb

## 2021-09-20 DIAGNOSIS — S52531D Colles' fracture of right radius, subsequent encounter for closed fracture with routine healing: Secondary | ICD-10-CM

## 2021-09-20 DIAGNOSIS — S52614D Nondisplaced fracture of right ulna styloid process, subsequent encounter for closed fracture with routine healing: Secondary | ICD-10-CM | POA: Diagnosis not present

## 2021-09-20 DIAGNOSIS — S52591D Other fractures of lower end of right radius, subsequent encounter for closed fracture with routine healing: Secondary | ICD-10-CM | POA: Diagnosis not present

## 2021-09-20 NOTE — Patient Instructions (Signed)
Good to see you Please use the brace over the next few weeks as needed  Please send me a message in MyChart with any questions or updates.  Please see me back as needed.   --Dr. Raeford Razor

## 2021-09-20 NOTE — Assessment & Plan Note (Signed)
Initial injury on 6/12.  Swelling and pain have improved. -Counseled on home exercise therapy and supportive care. -X-ray. -Counseled on weaning off the Velcro splint.

## 2021-09-20 NOTE — Progress Notes (Signed)
  Alexis Small - 81 y.o. female MRN [id number]  Date of birth: 12/28/1940  SUBJECTIVE:  Including CC & ROS.  No chief complaint on file.   Yakima Kreitzer Pacer is a 81 y.o. female that is following up for her right wrist fracture.  No significant pain today but she does have swelling over the distal ulna.  Having limited wrist flexion and extension.    Review of Systems See HPI   HISTORY: Past Medical, Surgical, Social, and Family History Reviewed & Updated per EMR.   Pertinent Historical Findings include:  Past Medical History:  Diagnosis Date   Osteopenia 03/05/2008   Qualifier: Diagnosis of  By: Madilyn Fireman MD, Lidia Collum     Past Surgical History:  Procedure Laterality Date   ABDOMINAL HYSTERECTOMY  1970's   Complete, 2x   APPENDECTOMY  1970's   BREAST SURGERY  1980's   reduction   CORNEAL TRANSPLANT  11/2014   [place]     PHYSICAL EXAM:  VS: BP 110/70 (BP Location: Left Arm, Patient Position: Sitting)   Ht '4\' 11"'$  (1.499 m)   Wt 136 lb (61.7 kg)   BMI 27.47 kg/m  Physical Exam Gen: NAD, alert, cooperative with exam, well-appearing MSK:  Neurovascularly intact       ASSESSMENT & PLAN:   Fracture, Colles, right, closed Initial injury on 6/12.  Swelling and pain have improved. -Counseled on home exercise therapy and supportive care. -X-ray. -Counseled on weaning off the Velcro splint.

## 2021-09-21 ENCOUNTER — Telehealth: Payer: Self-pay | Admitting: Family Medicine

## 2021-09-21 NOTE — Telephone Encounter (Signed)
Informed of results.   Rosemarie Ax, MD [facility] 09/21/2021, 4:55 PM

## 2021-11-08 ENCOUNTER — Encounter: Payer: Self-pay | Admitting: Family Medicine

## 2021-11-08 ENCOUNTER — Ambulatory Visit (INDEPENDENT_AMBULATORY_CARE_PROVIDER_SITE_OTHER): Payer: Medicare Other | Admitting: Family Medicine

## 2021-11-08 VITALS — BP 128/62 | HR 82 | Wt 143.0 lb

## 2021-11-08 DIAGNOSIS — Z13220 Encounter for screening for lipoid disorders: Secondary | ICD-10-CM | POA: Diagnosis not present

## 2021-11-08 DIAGNOSIS — Z780 Asymptomatic menopausal state: Secondary | ICD-10-CM

## 2021-11-08 DIAGNOSIS — E611 Iron deficiency: Secondary | ICD-10-CM

## 2021-11-08 DIAGNOSIS — Z0000 Encounter for general adult medical examination without abnormal findings: Secondary | ICD-10-CM | POA: Diagnosis not present

## 2021-11-08 DIAGNOSIS — E559 Vitamin D deficiency, unspecified: Secondary | ICD-10-CM

## 2021-11-08 NOTE — Progress Notes (Signed)
Complete physical exam  Patient: Alexis Small   DOB: 12-25-1940   81 y.o. Female  MRN: [id number]  Subjective:    Chief Complaint  Patient presents with   Annual Exam    Alexis Small is a 81 y.o. female who presents today for a complete physical exam. She reports consuming a general diet.  Walks 6000 steps per day.  She generally feels well. She reports sleeping well. She does have additional problems to discuss today.    Most recent fall risk assessment:    11/08/2021   11:00 AM  [place] in the past year? 1  Number falls in past yr: 0  Injury with Fall? 1  Risk for fall due to : History of fall(s)  Follow up Falls evaluation completed     Most recent depression screenings:    11/08/2021   11:00 AM 12/25/2020    4:59 PM  PHQ 2/9 Scores  PHQ - 2 Score 0 0      Past Surgical History:  Procedure Laterality Date   ABDOMINAL HYSTERECTOMY  1970's   Complete, 2x   APPENDECTOMY  1970's   BREAST SURGERY  1980's   reduction   CORNEAL TRANSPLANT  11/2014   [facility]   Social History   Tobacco Use   Smoking status: Never   Smokeless tobacco: Never  Substance Use Topics   Alcohol use: No   Drug use: No   Allergies  Allergen Reactions   Oxycodone-Acetaminophen     Other reaction(s): Other (See Comments) "FELT REAL WEIRD"   Prednisone Other (See Comments)    Sweats, chest pain, etc   Sulfamethoxazole     Other reaction(s): Other (See Comments) unknown      Patient Care Team: Hali Marry, MD as PCP - General   Outpatient Medications Prior to Visit  Medication Sig   AMBULATORY NON FORMULARY MEDICATION Take 2 tablets by mouth at bedtime. Medication Name: SKELATAL STRENGTH   Ascorbic Acid (VITAMIN C) 100 MG tablet Take 100 mg by mouth daily.   BORON PO Take by mouth daily.   Calcium Carbonate-Vitamin D 600-200 MG-UNIT TABS Take 1 tablet by mouth at bedtime.   docusate sodium (COLACE) 100 MG capsule Take 100 mg by mouth  daily as needed for mild constipation.   Menaquinone-7 (VITAMIN K2 PO) Take by mouth daily.   Multiple Vitamins-Minerals (ANTIOXIDANT PO) Take by mouth daily.   Multiple Vitamins-Minerals (ZINC PO) Take by mouth.   Niacin (VITAMIN B-3 PO) Take by mouth. Niacinmide   Omega 3-6-9 Fatty Acids (OMEGA 3-6-9 COMPLEX PO) Take by mouth.   Probiotic Product (PROBIOTIC-10 PO) Take by mouth daily.   pyridoxine (B-6) 100 MG tablet Take 100 mg by mouth daily.   Specialty Vitamins Products (ONE-A-DAY BONE STRENGTH PO) Take by mouth daily. Skeletal Stength   Turmeric 400 MG CAPS Take by mouth daily.   vitamin B-12 (CYANOCOBALAMIN) 50 MCG tablet Take 50 mcg by mouth daily.   No facility-administered medications prior to visit.    ROS        Objective:     BP 128/62   Pulse 82   Wt 143 lb (64.9 kg)   SpO2 97%   BMI 28.88 kg/m     Physical Exam Vitals and nursing note reviewed.  Constitutional:      Appearance: Normal appearance. She is well-developed.  HENT:     Head: Normocephalic and atraumatic.     Right Ear: Tympanic  membrane, ear canal and external ear normal.     Left Ear: Tympanic membrane, ear canal and external ear normal.     Nose: Nose normal.     Mouth/Throat:     Mouth: Mucous membranes are moist.     Pharynx: Oropharynx is clear. No posterior oropharyngeal erythema.  Eyes:     Conjunctiva/sclera: Conjunctivae normal.     Pupils: Pupils are equal, round, and reactive to light.  Neck:     Thyroid: No thyromegaly.  Cardiovascular:     Rate and Rhythm: Normal rate and regular rhythm.     Heart sounds: Normal heart sounds.  Pulmonary:     Effort: Pulmonary effort is normal.     Breath sounds: Normal breath sounds. No wheezing.  Abdominal:     General: Bowel sounds are normal.     Palpations: Abdomen is soft.     Tenderness: There is no abdominal tenderness.  Musculoskeletal:     Cervical back: Neck supple.  Lymphadenopathy:     Cervical: No cervical adenopathy.   Skin:    General: Skin is warm and dry.  Neurological:     Mental Status: She is alert and oriented to person, place, and time.  Psychiatric:        Behavior: Behavior normal.      No results found for any visits on 11/08/21.     Assessment & Plan:    Routine Health Maintenance and Physical Exam  Immunization History  Administered Date(s) Administered   Moderna Sars-Covid-2 Vaccination 10/12/2019   Pneumococcal Polysaccharide-23 12/15/2011   Tdap 09/14/2010, 05/20/2018   Zoster, Live 09/14/2010    Health Maintenance  Topic Date Due   INFLUENZA VACCINE  09/21/2021   Pneumonia Vaccine 59+ Years old (2 - PCV) 06/05/2022 (Originally 12/14/2012)   COVID-19 Vaccine (2 - Moderna risk series) 07/05/2023 (Originally 11/09/2019)   Zoster Vaccines- Shingrix (1 of 2) 09/18/2023 (Originally 08/15/1959)   TETANUS/TDAP  05/19/2028   DEXA SCAN  Completed   HPV VACCINES  Aged Out    Discussed health benefits of physical activity, and encouraged her to engage in regular exercise appropriate for her age and condition.  Problem List Items Addressed This Visit       Other   Vitamin D deficiency   Relevant Orders   Lipid Panel w/reflex Direct LDL   DG Bone Density   COMPLETE METABOLIC PANEL WITH GFR   Fe+TIBC+Fer   Low iron   Relevant Orders   Lipid Panel w/reflex Direct LDL   DG Bone Density   COMPLETE METABOLIC PANEL WITH GFR   Fe+TIBC+Fer   Other Visit Diagnoses     Wellness examination    -  Primary   Screening, lipid       Relevant Orders   Lipid Panel w/reflex Direct LDL   Post-menopausal       Relevant Orders   DG Bone Density       Keep up a regular exercise program and make sure you are eating a healthy diet Try to eat 4 servings of dairy a day, or if you are lactose intolerant take a calcium with vitamin D daily.  Your vaccines are up to date.  Due for some additional labs today she did have some others in the spring when she had come in for some fatigue  issues.    Return in about 1 year (around 11/09/2022) for Wellness Exam.     Beatrice Lecher, MD

## 2021-11-09 LAB — LIPID PANEL W/REFLEX DIRECT LDL
Cholesterol: 185 mg/dL (ref <200)
HDL: 85 mg/dL (ref >=50)
LDL Cholesterol (Calc): 77 mg/dL (calc)
Non-HDL Cholesterol (Calc): 100 mg/dL (calc) (ref <130)
Total CHOL/HDL Ratio: 2.2 (calc) (ref <5.0)
Triglycerides: 134 mg/dL (ref <150)

## 2021-11-09 LAB — COMPLETE METABOLIC PANEL WITH GFR
AG Ratio: 1.4 (calc) (ref 1.0–2.5)
ALT: 10 U/L (ref 6–29)
AST: 14 U/L (ref 10–35)
Albumin: 4.1 g/dL (ref 3.6–5.1)
Alkaline phosphatase (APISO): 83 U/L (ref 37–153)
BUN: 21 mg/dL (ref 7–25)
CO2: 30 mmol/L (ref 20–32)
Calcium: 9.7 mg/dL (ref 8.6–10.4)
Chloride: 104 mmol/L (ref 98–110)
Creat: 0.87 mg/dL (ref 0.60–0.95)
Globulin: 3 g/dL (calc) (ref 1.9–3.7)
Glucose, Bld: 84 mg/dL (ref 65–139)
Potassium: 4.6 mmol/L (ref 3.5–5.3)
Sodium: 140 mmol/L (ref 135–146)
Total Bilirubin: 0.3 mg/dL (ref 0.2–1.2)
Total Protein: 7.1 g/dL (ref 6.1–8.1)
eGFR: 67 mL/min/{1.73_m2} (ref >=60)

## 2021-11-09 LAB — IRON,TIBC AND FERRITIN PANEL
%SAT: 21 % (calc) (ref 16–45)
Ferritin: 60 ng/mL (ref 16–288)
Iron: 57 ug/dL (ref 45–160)
TIBC: 278 mcg/dL (calc) (ref 250–450)

## 2021-11-10 NOTE — Progress Notes (Signed)
Your lab work is within acceptable range and there are no concerning findings.   ?

## 2021-12-29 DIAGNOSIS — L738 Other specified follicular disorders: Secondary | ICD-10-CM | POA: Diagnosis not present

## 2021-12-29 DIAGNOSIS — L570 Actinic keratosis: Secondary | ICD-10-CM | POA: Diagnosis not present

## 2021-12-29 DIAGNOSIS — L309 Dermatitis, unspecified: Secondary | ICD-10-CM | POA: Diagnosis not present

## 2021-12-29 DIAGNOSIS — D485 Neoplasm of uncertain behavior of skin: Secondary | ICD-10-CM | POA: Diagnosis not present

## 2022-01-10 ENCOUNTER — Telehealth: Payer: Self-pay | Admitting: Family Medicine

## 2022-01-10 NOTE — Telephone Encounter (Signed)
Called patient regarding AWVI, patient declined. Alexis Small

## 2022-01-11 DIAGNOSIS — L814 Other melanin hyperpigmentation: Secondary | ICD-10-CM | POA: Diagnosis not present

## 2022-01-11 DIAGNOSIS — L810 Postinflammatory hyperpigmentation: Secondary | ICD-10-CM | POA: Diagnosis not present

## 2022-03-21 ENCOUNTER — Encounter: Payer: Self-pay | Admitting: Family Medicine

## 2022-03-21 ENCOUNTER — Ambulatory Visit: Payer: Medicare Other | Admitting: Family Medicine

## 2022-03-21 ENCOUNTER — Ambulatory Visit: Payer: Self-pay

## 2022-03-21 VITALS — BP 108/78 | Ht 59.0 in | Wt 142.0 lb

## 2022-03-21 DIAGNOSIS — M7661 Achilles tendinitis, right leg: Secondary | ICD-10-CM | POA: Diagnosis not present

## 2022-03-21 DIAGNOSIS — S92535A Nondisplaced fracture of distal phalanx of left lesser toe(s), initial encounter for closed fracture: Secondary | ICD-10-CM | POA: Diagnosis not present

## 2022-03-21 DIAGNOSIS — M7660 Achilles tendinitis, unspecified leg: Secondary | ICD-10-CM

## 2022-03-21 HISTORY — DX: Nondisplaced fracture of distal phalanx of left lesser toe(s), initial encounter for closed fracture: S92.535A

## 2022-03-21 MED ORDER — NAPROXEN 500 MG PO TABS: 500.0000 mg | ORAL_TABLET | Freq: Two times a day (BID) | ORAL | 3 refills | Status: DC

## 2022-03-21 NOTE — Assessment & Plan Note (Signed)
Acutely occurring over the past 2 months.  Has significant irritation at the deep portion of the mid belly of the Achilles. -Counseled on home exercise therapy and supportive care. -Green sport insoles with heel lifts. -Prednisone. -Could consider physical therapy or shockwave therapy

## 2022-03-21 NOTE — Assessment & Plan Note (Signed)
Acutely occurring where she got her toe stuck under a door.  Likely has a fracture in the distal phalanx of the fourth digit. -Counseled on supportive care. -Counseled on buddy taping

## 2022-03-21 NOTE — Progress Notes (Signed)
  Alexis Small - 82 y.o. female MRN [id number]  Date of birth: 1940-05-15  SUBJECTIVE:  Including CC & ROS.  No chief complaint on file.   Alexis Small is a 82 y.o. female that is presenting with acute right Achilles pain and left toe pain.  She struck the door with her left fourth toe.  Having swelling and tenderness in that area.  Having pain in the Achilles that does wake her up at night and is a burning sensation.    Review of Systems See HPI   HISTORY: Past Medical, Surgical, Social, and Family History Reviewed & Updated per EMR.   Pertinent Historical Findings include:  Past Medical History:  Diagnosis Date   Osteopenia 03/05/2008   Qualifier: Diagnosis of  By: Madilyn Fireman MD, Lidia Collum     Past Surgical History:  Procedure Laterality Date   ABDOMINAL HYSTERECTOMY  1970's   Complete, 2x   APPENDECTOMY  1970's   BREAST SURGERY  1980's   reduction   CORNEAL TRANSPLANT  11/2014   [place]     PHYSICAL EXAM:  VS: BP 108/78   Ht '4\' 11"'$  (1.499 m)   Wt 142 lb (64.4 kg)   BMI 28.68 kg/m  Physical Exam Gen: NAD, alert, cooperative with exam, well-appearing MSK:  Neurovascularly intact    Limited ultrasound: Right Achilles pain:  Small calcaneal spurs at the insertion of the Achilles. Significant hyperemia and thickening at the mid belly.  It measures 0.77 cm at the mid belly in diameter. No partial tears appreciated  Summary: Findings consistent with Achilles tendinitis  Ultrasound and interpretation by Clearance Coots, MD    ASSESSMENT & PLAN:   Achilles tendinitis of right lower extremity Acutely occurring over the past 2 months.  Has significant irritation at the deep portion of the mid belly of the Achilles. -Counseled on home exercise therapy and supportive care. -Green sport insoles with heel lifts. -Prednisone. -Could consider physical therapy or shockwave therapy  Closed nondisplaced fracture of distal phalanx of  lesser toe of left foot Acutely occurring where she got her toe stuck under a door.  Likely has a fracture in the distal phalanx of the fourth digit. -Counseled on supportive care. -Counseled on buddy taping

## 2022-03-21 NOTE — Patient Instructions (Signed)
Good to see you Please try the heel lifts with the insoles  Please use ice on the toe  Please try the exercises  Please send me a message in MyChart with any questions or updates.  Please see me back in 3 weeks.   --Dr. Raeford Razor

## 2022-04-11 ENCOUNTER — Telehealth: Payer: Self-pay | Admitting: Family Medicine

## 2022-04-11 NOTE — Telephone Encounter (Signed)
Contacted Alexis Small to schedule their annual wellness visit. Patient declined to schedule AWV at this time.  [name] Patient [employer] administrator II Direct Dial: [phone]

## 2022-04-13 ENCOUNTER — Ambulatory Visit: Payer: Medicare Other | Admitting: Family Medicine

## 2022-04-13 ENCOUNTER — Encounter: Payer: Self-pay | Admitting: Family Medicine

## 2022-04-13 VITALS — BP 126/72 | Ht 59.0 in | Wt 140.0 lb

## 2022-04-13 DIAGNOSIS — M7661 Achilles tendinitis, right leg: Secondary | ICD-10-CM

## 2022-04-13 DIAGNOSIS — M21621 Bunionette of right foot: Secondary | ICD-10-CM

## 2022-04-13 NOTE — Progress Notes (Signed)
  Alexis Small - 82 y.o. female MRN [id number]  Date of birth: 07-08-1940  SUBJECTIVE:  Including CC & ROS.  No chief complaint on file.   Alexis Small is a 83 y.o. female that is following up for her Achilles pain presenting with right lateral foot pain.  She is doing well with her Achilles.  The foot pain is intermittent in nature.  It is occurring at the base of the fifth metatarsal.    Review of Systems See HPI   HISTORY: Past Medical, Surgical, Social, and Family History Reviewed & Updated per EMR.   Pertinent Historical Findings include:  Past Medical History:  Diagnosis Date   Osteopenia 03/05/2008   Qualifier: Diagnosis of  By: Madilyn Fireman MD, Lidia Collum     Past Surgical History:  Procedure Laterality Date   ABDOMINAL HYSTERECTOMY  1970's   Complete, 2x   APPENDECTOMY  1970's   BREAST SURGERY  1980's   reduction   CORNEAL TRANSPLANT  11/2014   [place]     PHYSICAL EXAM:  VS: BP 126/72   Ht 4' 11"$  (1.499 m)   Wt 140 lb (63.5 kg)   BMI 28.28 kg/m  Physical Exam Gen: NAD, alert, cooperative with exam, well-appearing MSK:  Neurovascularly intact       ASSESSMENT & PLAN:   Bunionette of right foot Acutely occurring with mild irritation.  Occurring at the base of the fifth metatarsal. -Counseled on home exercise therapy and supportive care. -Added a lateral posting to the Green support insole.   Achilles tendinitis of right lower extremity Doing well with adjustments made previously. -Counseled on home exercise therapy and supportive care. -Counseled on continuing heel lifts -Could consider compression or shockwave therapy

## 2022-04-13 NOTE — Assessment & Plan Note (Signed)
Acutely occurring with mild irritation.  Occurring at the base of the fifth metatarsal. -Counseled on home exercise therapy and supportive care. -Added a lateral posting to the Green support insole.

## 2022-04-13 NOTE — Assessment & Plan Note (Signed)
Doing well with adjustments made previously. -Counseled on home exercise therapy and supportive care. -Counseled on continuing heel lifts -Could consider compression or shockwave therapy

## 2022-04-13 NOTE — Patient Instructions (Signed)
Good to see you  Please start the exercises  You can consider the compression  Please continue the heel lifts  Please send me a message in MyChart with any questions or updates.  Please see me back in 4 weeks.   --Dr. Raeford Razor

## 2022-04-28 DIAGNOSIS — L821 Other seborrheic keratosis: Secondary | ICD-10-CM | POA: Diagnosis not present

## 2022-04-28 DIAGNOSIS — L718 Other rosacea: Secondary | ICD-10-CM | POA: Diagnosis not present

## 2022-04-28 DIAGNOSIS — D225 Melanocytic nevi of trunk: Secondary | ICD-10-CM | POA: Diagnosis not present

## 2022-04-28 DIAGNOSIS — L814 Other melanin hyperpigmentation: Secondary | ICD-10-CM | POA: Diagnosis not present

## 2022-05-11 ENCOUNTER — Encounter: Payer: Self-pay | Admitting: Family Medicine

## 2022-05-11 ENCOUNTER — Ambulatory Visit: Payer: Medicare Other | Admitting: Family Medicine

## 2022-05-11 VITALS — Ht 59.0 in | Wt 140.0 lb

## 2022-05-11 DIAGNOSIS — M7661 Achilles tendinitis, right leg: Secondary | ICD-10-CM | POA: Diagnosis not present

## 2022-05-11 NOTE — Progress Notes (Signed)
  Alexis Small - 82 y.o. female MRN [id number]  Date of birth: Aug 04, 1940  SUBJECTIVE:  Including CC & ROS.  No chief complaint on file.   Alexis Small is a 82 y.o. female that is following up for her Achilles tendinitis.  She has started performing the exercises and reports a minimal amount of pain.  She is continuing to use the heel lifts and extra padding.  Denies any swelling at this time.  She does use Epsom salts intermittently.   Review of Systems See HPI   HISTORY: Past Medical, Surgical, Social, and Family History Reviewed & Updated per EMR.   Pertinent Historical Findings include:  Past Medical History:  Diagnosis Date   Osteopenia 03/05/2008   Qualifier: Diagnosis of  By: Madilyn Fireman MD, Lidia Collum     Past Surgical History:  Procedure Laterality Date   ABDOMINAL HYSTERECTOMY  1970's   Complete, 2x   APPENDECTOMY  1970's   BREAST SURGERY  1980's   reduction   CORNEAL TRANSPLANT  11/2014   [place]     PHYSICAL EXAM:  VS: Ht 4\' 11"  (1.499 m)   Wt 140 lb (63.5 kg)   BMI 28.28 kg/m  Physical Exam Gen: NAD, alert, cooperative with exam, well-appearing MSK:  Neurovascularly intact       ASSESSMENT & PLAN:   Achilles tendinitis of right lower extremity Doing well with the modalities today.  She does have some pain with the exercises -Counseled on home exercise therapy and supportive care. -Continue heel lifts. -Could consider physical therapy

## 2022-05-11 NOTE — Patient Instructions (Signed)
Good to see you Please continue the exercises as long as you don't have pain greater than 5/10.  You can increase the number of sets until you reach 10 per set.   Please send me a message in MyChart with any questions or updates.  Please see me back as needed.   --Dr. Raeford Razor

## 2022-05-11 NOTE — Assessment & Plan Note (Signed)
Doing well with the modalities today.  She does have some pain with the exercises -Counseled on home exercise therapy and supportive care. -Continue heel lifts. -Could consider physical therapy

## 2022-06-07 ENCOUNTER — Encounter: Payer: Self-pay | Admitting: *Deleted

## 2022-06-28 DIAGNOSIS — Z947 Corneal transplant status: Secondary | ICD-10-CM | POA: Diagnosis not present

## 2022-08-07 DIAGNOSIS — Z885 Allergy status to narcotic agent status: Secondary | ICD-10-CM | POA: Diagnosis not present

## 2022-08-07 DIAGNOSIS — Z882 Allergy status to sulfonamides status: Secondary | ICD-10-CM | POA: Diagnosis not present

## 2022-08-07 DIAGNOSIS — Z888 Allergy status to other drugs, medicaments and biological substances status: Secondary | ICD-10-CM | POA: Diagnosis not present

## 2022-08-07 DIAGNOSIS — H5711 Ocular pain, right eye: Secondary | ICD-10-CM | POA: Diagnosis not present

## 2022-08-09 ENCOUNTER — Ambulatory Visit (INDEPENDENT_AMBULATORY_CARE_PROVIDER_SITE_OTHER): Payer: Medicare Other | Admitting: Physician Assistant

## 2022-08-09 ENCOUNTER — Encounter: Payer: Self-pay | Admitting: Physician Assistant

## 2022-08-09 VITALS — BP 130/67 | HR 78 | Ht 59.0 in | Wt 140.0 lb

## 2022-08-09 DIAGNOSIS — H5711 Ocular pain, right eye: Secondary | ICD-10-CM

## 2022-08-09 DIAGNOSIS — H1013 Acute atopic conjunctivitis, bilateral: Secondary | ICD-10-CM | POA: Diagnosis not present

## 2022-08-09 DIAGNOSIS — H00012 Hordeolum externum right lower eyelid: Secondary | ICD-10-CM | POA: Insufficient documentation

## 2022-08-09 MED ORDER — FEXOFENADINE HCL 180 MG PO TABS: 180.0000 mg | ORAL_TABLET | Freq: Every day | ORAL | 1 refills | Status: DC

## 2022-08-09 NOTE — Patient Instructions (Signed)
Try allegra to replace zyrtec.  Continue erythromycin ointment for 7 total days  Allergic Conjunctivitis, Adult  Allergic conjunctivitis is inflammation of the clear membrane that covers the white part of your eye and the inner surface of your eyelid. This area is called the conjunctiva. This condition can make your eye red or pink. It can also make your eye feel itchy. This condition is not contagious. This means that it cannot be spread from one person to another person. What are the causes? This condition is caused by allergens. These are things that can cause an allergic reaction in some people. Common allergens include: Outdoor allergens, such as: Pollen, including pollen from grass and weeds. Mold. Car fumes. Indoor allergens, such as: Dust. Smoke. Mold. Proteins in a pet's pee (urine), saliva, or dander. Proteins that build up in contact lenses. What increases the risk? You are more likely to develop this condition if you have a family history of these things: Allergies. Conditions that you get because of allergens, such as asthma or inflammation of the skin (eczema). What are the signs or symptoms? Symptoms of this condition include eyes that are: Itchy. Red. Watery. Puffy. Your eyes may also: Sting or burn. Have clear fluid draining from them. Have thick mucus coming from them. This happens in severe cases. How is this treated? Treatment for this condition may include: Using cold, wet cloths (cold compresses) to soothe itching and swelling. Washing the face, hair, and clothing to remove allergens. Using eye drops. These may include: Eye drops that block allergies. Eye drops that reduce swelling and irritation. Steroid eye drops if other treatments have not worked. Oral antihistamine medicines. These medicines lessen your allergies. You may need these if eye drops do not help or are difficult to use. Air purifier at home and work. Wrap around sunglasses. This may  help to block allergens from reaching the eye. Not wearing contact lenses, if the doctor has found that contact lenses caused your symptoms. Use daily wear disposal contact lenses instead. Follow these instructions at home: Eye care Place a cool, clean washcloth on your eye for 10-20 minutes. Do this 3-4 times a day. Do not touch or rub your eyes. Do not wear contact lenses until the inflammation is gone. Wear glasses instead. Do not wear eye makeup until the inflammation is gone. General instructions Try not to be around things that you are allergic to. Take or apply over-the-counter and prescription medicines only as told by your doctor. These include any eye drops. Drink enough fluid to keep your pee pale yellow. Keep all follow-up visits. Contact a doctor if: Your symptoms get worse. Your symptoms do not get better with treatment. You have mild eye pain. You are sensitive to light. You have spots or blisters on your eyes. You have pus coming from your eye. You have a fever. Get help right away if: You have redness, swelling, or other symptoms in only one eye. You cannot see well. You have other vision changes. You have very bad eye pain. Summary Allergic conjunctivitis is caused by allergens. It can make your eye red or pink, and it can make your eye feel itchy. This condition cannot be spread from one person to another person. Avoid things that you are allergic to. Take or apply over-the-counter and prescription medicines only as told by your doctor. These include any eye drops. Contact your doctor if your symptoms get worse or they do not get better with treatment. This information is not intended  to replace advice given to you by your health care provider. Make sure you discuss any questions you have with your health care provider. Document Revised: 04/16/2021 Document Reviewed: 04/16/2021 Elsevier Patient Education  2024 [employer].

## 2022-08-09 NOTE — Progress Notes (Signed)
   Acute Office Visit  Subjective:     Patient ID: Alexis Small, female    DOB: 02/03/1941, 82 y.o.   MRN: [id number]  Chief Complaint  Patient presents with   Follow-up    ER visit    Patient with PMH of b/l Fuch's dystrophy and a left corneal transplant presents for f/u after ED visit at [facility] on 08/07/22. Patient presented to the ED with right eye pain and was diagnosed with a R corneal abrasion and an external hordeolum on the right lower lid. Today patient reports no pain since use of a numbing agent in the ED. Patient has been applying erythromycin ophthalmic ointment (0.5%) 5x/day since discharge on 6/16. Patient says there is a small area of residual redness where the stye was but that it does not hurt and is not draining. Patient denies trauma or FB. She denies vision changes, HA, and fever. She will f/u with her eye doctor on 10/15/22.  Patient also complains of excessive tearing over the last several months. She normally takes Zyrtec for allergies and uses unspecified eye drops every morning. Denies itching and redness.    Review of Systems  Constitutional:  Negative for chills and fever.  Eyes:  Negative for blurred vision, double vision, photophobia, pain, discharge and redness.  Skin:  Negative for itching.  Neurological:  Negative for dizziness and headaches.        Objective:    BP 130/67 (BP Location: Left Arm, Patient Position: Sitting, Cuff Size: Normal)   Pulse 78   Ht 4\' 11"  (1.499 m)   Wt 140 lb (63.5 kg)   SpO2 93%   BMI 28.28 kg/m  BP Readings from Last 3 Encounters:  08/09/22 130/67  04/13/22 126/72  03/21/22 108/78   Wt Readings from Last 3 Encounters:  08/09/22 140 lb (63.5 kg)  05/11/22 140 lb (63.5 kg)  04/13/22 140 lb (63.5 kg)      Physical Exam Eyes:     Extraocular Movements: Extraocular movements intact.     Pupils: Pupils are equal, round, and reactive to light.     Comments: Area of scant erythema along lower eyelid of right  eye where original stye was. No swelling or discharge. Patient's eyes are teary b/l.  Neurological:     Mental Status: She is alert and oriented to person, place, and time. Mental status is at baseline.          Assessment & Plan:  Marland KitchenMarland KitchenIlicia was seen today for follow-up.  Diagnoses and all orders for this visit:  Acute right eye pain  Allergic conjunctivitis of both eyes -     fexofenadine (ALLEGRA) 180 MG tablet; Take 1 tablet (180 mg total) by mouth daily.  Hordeolum externum of right lower eyelid   Will continue use of erythromycin ointment for 5 days (total of 7 days post-discharge) Will d/c Zyrtec Will start Allegra bid for allergic conjunctivitis  Maintain f/u with ophthalmology on 8/24 as scheduled  Return if symptoms worsen or fail to improve.  Tandy Gaw, PA-C

## 2022-08-17 DIAGNOSIS — H18831 Recurrent erosion of cornea, right eye: Secondary | ICD-10-CM | POA: Diagnosis not present

## 2022-08-17 DIAGNOSIS — H18232 Secondary corneal edema, left eye: Secondary | ICD-10-CM | POA: Diagnosis not present

## 2022-09-14 DIAGNOSIS — Z947 Corneal transplant status: Secondary | ICD-10-CM | POA: Diagnosis not present

## 2022-09-14 DIAGNOSIS — H18232 Secondary corneal edema, left eye: Secondary | ICD-10-CM | POA: Diagnosis not present

## 2022-10-10 ENCOUNTER — Telehealth: Payer: Self-pay | Admitting: Family Medicine

## 2022-10-10 ENCOUNTER — Ambulatory Visit (INDEPENDENT_AMBULATORY_CARE_PROVIDER_SITE_OTHER): Payer: Medicare Other | Admitting: Family Medicine

## 2022-10-10 ENCOUNTER — Encounter: Payer: Self-pay | Admitting: Family Medicine

## 2022-10-10 ENCOUNTER — Ambulatory Visit (INDEPENDENT_AMBULATORY_CARE_PROVIDER_SITE_OTHER): Payer: Medicare Other

## 2022-10-10 VITALS — BP 154/72 | HR 75 | Ht 59.0 in | Wt 145.8 lb

## 2022-10-10 DIAGNOSIS — M79671 Pain in right foot: Secondary | ICD-10-CM

## 2022-10-10 DIAGNOSIS — Z780 Asymptomatic menopausal state: Secondary | ICD-10-CM | POA: Diagnosis not present

## 2022-10-10 DIAGNOSIS — M25571 Pain in right ankle and joints of right foot: Secondary | ICD-10-CM

## 2022-10-10 NOTE — Progress Notes (Signed)
Hi Alexis Small, great news no sign of fracture or dislocation in the foot or ankle.  This is reassuring [name] and continue with conservative care including ice, elevation and anti-inflammatory.  If it is not improving over the next 2 weeks then like to get you in with our sports medicine provider.

## 2022-10-10 NOTE — Telephone Encounter (Signed)
Change to stat. I called and left a message for patient to call back about a Dexa scan.

## 2022-10-10 NOTE — Telephone Encounter (Signed)
Please call imaging and see if they can change the foot and ankle films to stat.  Also please call patient and let her know that we really do need to get an up-to-date bone density test to follow-up from the fracture that she had about a year or 2 ago.

## 2022-10-10 NOTE — Progress Notes (Signed)
   Acute Office Visit  Subjective:     Patient ID: Alexis Small, female    DOB: Jul 16, 1940, 82 y.o.   MRN: [id number]  Chief Complaint  Patient presents with   Foot Swelling    Right side - x 1 week    HPI Patient is in today for right foot pain she says about a week ago she stepped out of her car and noticed that her foot was hurting particularly the right foot.  She says that her daughter noticed that it looks little swollen she even took a photo it was very swollen over the lateral ankle.  She does not remember any injury or trauma she had not been walking more than usual or done anything out of the norm that day.  Since then she has been trying to elevate it, ice it, do Epsom salt soaks and take ibuprofen.  Some of the swelling has gone down.  But she still having some sensitivity and tenderness.  ROS      Objective:    BP (!) 154/72   Pulse 75   Ht 4\' 11"  (1.499 m)   Wt 145 lb 12.8 oz (66.1 kg)   SpO2 98%   BMI 29.45 kg/m    Physical Exam  No results found for any visits on 10/10/22.      Assessment & Plan:   Problem List Items Addressed This Visit   None Visit Diagnoses     Right foot pain    -  Primary   Relevant Orders   DG Foot Complete Right   DG Ankle Complete Right   Acute right ankle pain       Relevant Orders   DG Foot Complete Right   DG Ankle Complete Right   Post-menopausal       Relevant Orders   DG Bone Density      Acute pain and swelling of right foot/no trauma. She has some bony tenderness. Will get xrays to rule out fracture.  Continue conservative tx and see if continue to get better. Ok to use IBU for pain relief.    Looking back through her records she had not seen Dr. Noreene Filbert after a fracture from a fall and he had recommended getting an up-to-date bone density and treatment specifically with possible Evenity.  It looks like she never had the bone density performed.  I had actually ordered a bone density about a year ago  and that was not completed either.  No orders of the defined types were placed in this encounter.   No follow-ups on file.  Alexis Gasser, MD

## 2022-10-11 ENCOUNTER — Telehealth: Payer: Self-pay

## 2022-10-11 NOTE — Telephone Encounter (Signed)
Pt returned the call is aware of results, and would like to have bone scan done. Thanks Roselyn Reef, CMA

## 2022-10-11 NOTE — Telephone Encounter (Signed)
DEXA ordered 8/19

## 2022-10-12 NOTE — Telephone Encounter (Signed)
See other telephone message 

## 2022-10-21 DIAGNOSIS — H04121 Dry eye syndrome of right lacrimal gland: Secondary | ICD-10-CM | POA: Diagnosis not present

## 2022-10-21 DIAGNOSIS — H2511 Age-related nuclear cataract, right eye: Secondary | ICD-10-CM | POA: Diagnosis not present

## 2022-10-26 ENCOUNTER — Ambulatory Visit (INDEPENDENT_AMBULATORY_CARE_PROVIDER_SITE_OTHER): Payer: Medicare Other

## 2022-10-26 DIAGNOSIS — Z780 Asymptomatic menopausal state: Secondary | ICD-10-CM

## 2022-10-26 DIAGNOSIS — M810 Age-related osteoporosis without current pathological fracture: Secondary | ICD-10-CM | POA: Diagnosis not present

## 2022-10-31 NOTE — Progress Notes (Signed)
Call patient: Bone density score shows a T-score of -2.8 consistent with osteoporosis.   The current recommendation for osteoporosis treatment includes:   #1 calcium-total of 1200 mg of calcium daily.  If you eat a very calcium rich diet you may be able to obtain that without a supplement.  If not, then I recommend calcium 500 mg twice a day.  There are several products over-the-counter such as Caltrate D and Viactiv chews which are great options that contain calcium and vitamin D. #2 vitamin D-recommend 800 international units daily. #3 exercise-recommend 30 minutes of weightbearing exercise 3 days a week.  Resistance training ,such as doing bands and light weights, can be particularly helpful. #4 medication-if you are not currently on a bone builder, also called a bisphosphonate, then this has been shown to be very helpful in maintaining bone strength, preventing further thinning of the bones, and reducing your risk for fractures.  I would highly recommend that you consider starting 1 of these medications.  If you are okay with that then please let us know and we will send one to your pharmacy.  If you would like to discuss further we are happy to make an appointment for you so that we can go over options for treatment.

## 2022-11-10 ENCOUNTER — Encounter: Payer: Self-pay | Admitting: Family Medicine

## 2022-11-10 ENCOUNTER — Ambulatory Visit (INDEPENDENT_AMBULATORY_CARE_PROVIDER_SITE_OTHER): Payer: Medicare Other | Admitting: Family Medicine

## 2022-11-10 VITALS — BP 126/74 | HR 80 | Ht 59.0 in | Wt 142.0 lb

## 2022-11-10 DIAGNOSIS — R5383 Other fatigue: Secondary | ICD-10-CM

## 2022-11-10 DIAGNOSIS — Z0000 Encounter for general adult medical examination without abnormal findings: Secondary | ICD-10-CM

## 2022-11-10 DIAGNOSIS — R21 Rash and other nonspecific skin eruption: Secondary | ICD-10-CM

## 2022-11-10 DIAGNOSIS — Z13220 Encounter for screening for lipoid disorders: Secondary | ICD-10-CM | POA: Diagnosis not present

## 2022-11-10 MED ORDER — NYSTATIN 100000 UNIT/GM EX CREA: 1.0000 | TOPICAL_CREAM | Freq: Two times a day (BID) | CUTANEOUS | 0 refills | Status: DC

## 2022-11-10 NOTE — Progress Notes (Signed)
Complete physical exam  Patient: Alexis Small   DOB: January 20, 1941   82 y.o. Female  MRN: [id number]  Subjective:    Chief Complaint  Patient presents with   Annual Exam         Alexis Small is a 82 y.o. female who presents today for a complete physical exam. She reports consuming a general diet. The patient does not participate in regular exercise at present. She generally feels fairly well. She reports sleeping poorly. She does not have additional problems to discuss today.    Most recent fall risk assessment:    11/10/2022   10:48 AM  Fall Risk   Falls in the past year? 0  Number falls in past yr: 0  Injury with Fall? 0  Risk for fall due to : No Fall Risks  Follow up Falls evaluation completed     Most recent depression screenings:    11/10/2022   10:48 AM 11/08/2021   11:00 AM  PHQ 2/9 Scores  PHQ - 2 Score 0 0        Patient Care Team: Agapito Games, MD as PCP - General   Outpatient Medications Prior to Visit  Medication Sig   AMBULATORY NON FORMULARY MEDICATION Take 2 tablets by mouth at bedtime. Medication Name: SKELATAL STRENGTH   Ascorbic Acid (VITAMIN C) 100 MG tablet Take 100 mg by mouth daily.   BORON PO Take by mouth daily.   Calcium Carbonate-Vitamin D 600-200 MG-UNIT TABS Take 1 tablet by mouth at bedtime.   docusate sodium (COLACE) 100 MG capsule Take 100 mg by mouth daily as needed for mild constipation.   Menaquinone-7 (VITAMIN K2 PO) Take by mouth daily.   Multiple Vitamins-Minerals (ANTIOXIDANT PO) Take by mouth daily.   Multiple Vitamins-Minerals (ZINC PO) Take by mouth.   naproxen (NAPROSYN) 500 MG tablet Take 1 tablet (500 mg total) by mouth 2 (two) times daily with a meal.   Niacin (VITAMIN B-3 PO) Take by mouth. Niacinmide   Omega 3-6-9 Fatty Acids (OMEGA 3-6-9 COMPLEX PO) Take by mouth.   Probiotic Product (PROBIOTIC-10 PO) Take by mouth daily.   pyridoxine (B-6) 100 MG tablet Take 100 mg by mouth daily.    Specialty Vitamins Products (ONE-A-DAY BONE STRENGTH PO) Take by mouth daily. Skeletal Stength   Turmeric 400 MG CAPS Take by mouth daily.   vitamin B-12 (CYANOCOBALAMIN) 50 MCG tablet Take 50 mcg by mouth daily.   No facility-administered medications prior to visit.    ROS        Objective:     BP 126/74   Pulse 80   Ht 4\' 11"  (1.499 m)   Wt 142 lb (64.4 kg)   SpO2 100%   BMI 28.68 kg/m     Physical Exam Constitutional:      Appearance: Normal appearance.  HENT:     Head: Normocephalic and atraumatic.     Right Ear: Tympanic membrane, ear canal and external ear normal. There is no impacted cerumen.     Left Ear: Tympanic membrane, ear canal and external ear normal. There is no impacted cerumen.     Nose: Nose normal.     Mouth/Throat:     Pharynx: Oropharynx is clear.  Eyes:     Extraocular Movements: Extraocular movements intact.     Conjunctiva/sclera: Conjunctivae normal.     Pupils: Pupils are equal, round, and reactive to light.  Neck:     Thyroid: No thyromegaly.  Cardiovascular:  Rate and Rhythm: Normal rate and regular rhythm.  Pulmonary:     Effort: Pulmonary effort is normal.     Breath sounds: Normal breath sounds.  Abdominal:     General: Bowel sounds are normal.     Palpations: Abdomen is soft.     Tenderness: There is no abdominal tenderness.  Musculoskeletal:        General: No swelling.     Cervical back: Neck supple. No tenderness.  Lymphadenopathy:     Cervical: No cervical adenopathy.  Skin:    General: Skin is warm and dry.     Findings: Rash present.     Comments: A few scattered erythematous papules in the mid chest area just below the breasts.  Neurological:     Mental Status: She is alert and oriented to person, place, and time.  Psychiatric:        Mood and Affect: Mood normal.        Behavior: Behavior normal.      No results found for any visits on 11/10/22.      Assessment & Plan:    Routine Health Maintenance and  Physical Exam  Immunization History  Administered Date(s) Administered   Moderna Sars-Covid-2 Vaccination 10/12/2019   Pneumococcal Polysaccharide-23 12/15/2011   Tdap 09/14/2010, 05/20/2018   Zoster, Live 09/14/2010    Health Maintenance  Topic Date Due   Medicare Annual Wellness (AWV)  10/29/2016   INFLUENZA VACCINE  05/22/2023 (Originally 09/22/2022)   COVID-19 Vaccine (2 - Moderna risk series) 07/05/2023 (Originally 11/09/2019)   Zoster Vaccines- Shingrix (1 of 2) 09/18/2023 (Originally 08/15/1959)   Pneumonia Vaccine 18+ Years old (2 of 2 - PCV) 01/15/2024 (Originally 12/14/2012)   DTaP/Tdap/Td (3 - Td or Tdap) 05/19/2028   DEXA SCAN  Completed   HPV VACCINES  Aged Out    Discussed health benefits of physical activity, and encouraged her to engage in regular exercise appropriate for her age and condition.  Problem List Items Addressed This Visit   None Visit Diagnoses     Wellness examination    -  Primary   Other fatigue       Relevant Orders   CMP14+EGFR   Lipid panel   CBC   Screening, lipid       Relevant Orders   Lipid panel   Rash       Relevant Medications   nystatin cream (MYCOSTATIN)     Keep up a regular exercise program and make sure you are eating a healthy diet Try to eat 4 servings of dairy a day, or if you are lactose intolerant take a calcium with vitamin D daily.  Your vaccines are up to date.   Rash-She said she noticed it several days ago and she has been using Tiger balm on it.  Will send over prescription for nystatin.  Call if not better in 1 week.  Return in about 1 year (around 11/10/2023) for Wellness Exam.     [name], MD

## 2022-11-10 NOTE — Progress Notes (Signed)
Annual Wellness Visit     Patient: Alexis Small, Female    DOB: 04-Oct-1940, 82 y.o.   MRN: [id number]  Subjective  Chief Complaint  Patient presents with   Annual Exam         Alexis Small is a 82 y.o. female who presents today for her Annual Wellness Visit. She reports consuming a general diet. She reports consuming a general diet. The patient does not participate in regular exercise at present because of issues with Achilles tendinitis earlier this year. She generally feels fairly well. She reports sleeping poorly. She does not have additional problems to discuss today.   HPI       Medications: Outpatient Medications Prior to Visit  Medication Sig   AMBULATORY NON FORMULARY MEDICATION Take 2 tablets by mouth at bedtime. Medication Name: SKELATAL STRENGTH   Ascorbic Acid (VITAMIN C) 100 MG tablet Take 100 mg by mouth daily.   BORON PO Take by mouth daily.   Calcium Carbonate-Vitamin D 600-200 MG-UNIT TABS Take 1 tablet by mouth at bedtime.   docusate sodium (COLACE) 100 MG capsule Take 100 mg by mouth daily as needed for mild constipation.   Menaquinone-7 (VITAMIN K2 PO) Take by mouth daily.   Multiple Vitamins-Minerals (ANTIOXIDANT PO) Take by mouth daily.   Multiple Vitamins-Minerals (ZINC PO) Take by mouth.   naproxen (NAPROSYN) 500 MG tablet Take 1 tablet (500 mg total) by mouth 2 (two) times daily with a meal.   Niacin (VITAMIN B-3 PO) Take by mouth. Niacinmide   Omega 3-6-9 Fatty Acids (OMEGA 3-6-9 COMPLEX PO) Take by mouth.   Probiotic Product (PROBIOTIC-10 PO) Take by mouth daily.   pyridoxine (B-6) 100 MG tablet Take 100 mg by mouth daily.   Specialty Vitamins Products (ONE-A-DAY BONE STRENGTH PO) Take by mouth daily. Skeletal Stength   Turmeric 400 MG CAPS Take by mouth daily.   vitamin B-12 (CYANOCOBALAMIN) 50 MCG tablet Take 50 mcg by mouth daily.   No facility-administered medications prior to visit.    Allergies  Allergen Reactions    Oxycodone-Acetaminophen     Other reaction(s): Other (See Comments) "FELT REAL WEIRD"   Prednisone Other (See Comments)    Sweats, chest pain, etc   Sulfamethoxazole     Other reaction(s): Other (See Comments) unknown    Patient Care Team: Agapito Games, MD as PCP - General  ROS      Objective  BP 126/74   Pulse 80   Ht 4\' 11"  (1.499 m)   Wt 142 lb (64.4 kg)   SpO2 100%   BMI 28.68 kg/m    Physical Exam    Most recent functional status assessment:    11/10/2022   12:47 PM  In your present state of health, do you have any difficulty performing the following activities:  Hearing? 1  Vision? 0  Difficulty concentrating or making decisions? 0  Walking or climbing stairs? 0  Dressing or bathing? 0  Doing errands, shopping? 0  Preparing Food and eating ? N  Using the Toilet? N  In the past six months, have you accidently leaked urine? Y  Do you have problems with loss of bowel control? N  Managing your Medications? N  Managing your Finances? N  Housekeeping or managing your Housekeeping? N   Most recent fall risk assessment:    11/10/2022   10:48 AM  Fall Risk   Falls in the past year? 0  Number falls in past yr: 0  Injury  with Fall? 0  Risk for fall due to : No Fall Risks  Follow up Falls evaluation completed    Most recent depression screenings:    11/10/2022   10:48 AM 11/08/2021   11:00 AM  PHQ 2/9 Scores  PHQ - 2 Score 0 0   Most recent cognitive screening:    11/10/2022    1:36 PM  6CIT Screen  What Year? 0 points  What month? 0 points  What time? 0 points  Count back from 20 0 points  Months in reverse 0 points  Repeat phrase 0 points  Total Score 0 points   Most recent Audit-C alcohol use screening     No data to display         A score of 3 or more in women, and 4 or more in men indicates increased risk for alcohol abuse, EXCEPT if all of the points are from question 1   Vision/Hearing Screen: No results  found.    No results found for any visits on 11/10/22.    Assessment & Plan   Annual wellness visit done today including the all of the following: Reviewed patient's Family Medical History Reviewed and updated list of patient's medical providers Assessment of cognitive impairment was done Assessed patient's functional ability Established a written schedule for health screening services Health Risk Assessent Completed and Reviewed  Exercise Activities and Dietary recommendations  Goals   None     Immunization History  Administered Date(s) Administered   Moderna Sars-Covid-2 Vaccination 10/12/2019   Pneumococcal Polysaccharide-23 12/15/2011   Tdap 09/14/2010, 05/20/2018   Zoster, Live 09/14/2010    Health Maintenance  Topic Date Due   Medicare Annual Wellness (AWV)  10/29/2016   INFLUENZA VACCINE  05/22/2023 (Originally 09/22/2022)   COVID-19 Vaccine (2 - Moderna risk series) 07/05/2023 (Originally 11/09/2019)   Zoster Vaccines- Shingrix (1 of 2) 09/18/2023 (Originally 08/15/1959)   Pneumonia Vaccine 79+ Years old (2 of 2 - PCV) 01/15/2024 (Originally 12/14/2012)   DTaP/Tdap/Td (3 - Td or Tdap) 05/19/2028   DEXA SCAN  Completed   HPV VACCINES  Aged Out     Discussed health benefits of physical activity, and encouraged her to engage in regular exercise appropriate for her age and condition.    Problem List Items Addressed This Visit   None Visit Diagnoses     Wellness examination    -  Primary   Other fatigue       Relevant Orders   CMP14+EGFR   Lipid panel   CBC   Screening, lipid       Relevant Orders   Lipid panel   Rash       Relevant Medications   nystatin cream (MYCOSTATIN)       Return in about 1 year (around 11/10/2023) for Wellness Exam.     [name], MD

## 2022-11-11 DIAGNOSIS — H2511 Age-related nuclear cataract, right eye: Secondary | ICD-10-CM | POA: Diagnosis not present

## 2022-11-11 LAB — CMP14+EGFR
ALT: 10 IU/L (ref 0–32)
AST: 17 IU/L (ref 0–40)
Albumin: 4.4 g/dL (ref 3.7–4.7)
Alkaline Phosphatase: 87 IU/L (ref 44–121)
BUN/Creatinine Ratio: 24 (ref 12–28)
BUN: 22 mg/dL (ref 8–27)
Bilirubin Total: 0.7 mg/dL (ref 0.0–1.2)
CO2: 24 mmol/L (ref 20–29)
Calcium: 9.6 mg/dL (ref 8.7–10.3)
Chloride: 106 mmol/L (ref 96–106)
Creatinine, Ser: 0.9 mg/dL (ref 0.57–1.00)
Globulin, Total: 2.6 g/dL (ref 1.5–4.5)
Glucose: 99 mg/dL (ref 70–99)
Potassium: 3.9 mmol/L (ref 3.5–5.2)
Sodium: 146 mmol/L — ABNORMAL HIGH (ref 134–144)
Total Protein: 7 g/dL (ref 6.0–8.5)
eGFR: 64 mL/min/{1.73_m2} (ref >59)

## 2022-11-11 LAB — CBC
Hematocrit: 41 % (ref 34.0–46.6)
Hemoglobin: 13.4 g/dL (ref 11.1–15.9)
MCH: 29.5 pg (ref 26.6–33.0)
MCHC: 32.7 g/dL (ref 31.5–35.7)
MCV: 90 fL (ref 79–97)
Platelets: 186 10*3/uL (ref 150–450)
RBC: 4.54 x10E6/uL (ref 3.77–5.28)
RDW: 13.5 % (ref 11.7–15.4)
WBC: 5.7 10*3/uL (ref 3.4–10.8)

## 2022-11-11 LAB — LIPID PANEL
Chol/HDL Ratio: 1.7 ratio (ref 0.0–4.4)
Cholesterol, Total: 169 mg/dL (ref 100–199)
HDL: 97 mg/dL (ref >39)
LDL Chol Calc (NIH): 57 mg/dL (ref 0–99)
Triglycerides: 83 mg/dL (ref 0–149)
VLDL Cholesterol Cal: 15 mg/dL (ref 5–40)

## 2022-11-11 NOTE — Progress Notes (Signed)
Call patient: Her sodium was a little elevated.  I am not quite sure why it is never been elevated before send like to recheck that in 1 to 2 weeks to verify if it is accurate.  Kidney and liver are stable.  Cholesterol looks great!  Blood count is normal.

## 2022-11-14 ENCOUNTER — Other Ambulatory Visit: Payer: Self-pay

## 2022-11-14 DIAGNOSIS — R899 Unspecified abnormal finding in specimens from other organs, systems and tissues: Secondary | ICD-10-CM

## 2022-11-14 DIAGNOSIS — Z888 Allergy status to other drugs, medicaments and biological substances status: Secondary | ICD-10-CM | POA: Diagnosis not present

## 2022-11-14 DIAGNOSIS — H579 Unspecified disorder of eye and adnexa: Secondary | ICD-10-CM | POA: Diagnosis not present

## 2022-11-14 DIAGNOSIS — H2511 Age-related nuclear cataract, right eye: Secondary | ICD-10-CM | POA: Diagnosis not present

## 2022-11-14 DIAGNOSIS — M1990 Unspecified osteoarthritis, unspecified site: Secondary | ICD-10-CM | POA: Diagnosis not present

## 2022-11-14 DIAGNOSIS — Z79899 Other long term (current) drug therapy: Secondary | ICD-10-CM | POA: Diagnosis not present

## 2023-01-04 DIAGNOSIS — D692 Other nonthrombocytopenic purpura: Secondary | ICD-10-CM | POA: Diagnosis not present

## 2023-01-04 DIAGNOSIS — L821 Other seborrheic keratosis: Secondary | ICD-10-CM | POA: Diagnosis not present

## 2023-01-12 ENCOUNTER — Ambulatory Visit (INDEPENDENT_AMBULATORY_CARE_PROVIDER_SITE_OTHER): Payer: Medicare Other | Admitting: Family Medicine

## 2023-01-12 ENCOUNTER — Ambulatory Visit: Payer: Medicare Other

## 2023-01-12 ENCOUNTER — Telehealth: Payer: Self-pay | Admitting: Family Medicine

## 2023-01-12 ENCOUNTER — Encounter: Payer: Self-pay | Admitting: Family Medicine

## 2023-01-12 VITALS — BP 130/82 | HR 85 | Ht 59.0 in | Wt 137.0 lb

## 2023-01-12 DIAGNOSIS — M25552 Pain in left hip: Secondary | ICD-10-CM

## 2023-01-12 DIAGNOSIS — M5432 Sciatica, left side: Secondary | ICD-10-CM

## 2023-01-12 DIAGNOSIS — M4726 Other spondylosis with radiculopathy, lumbar region: Secondary | ICD-10-CM | POA: Diagnosis not present

## 2023-01-12 DIAGNOSIS — M160 Bilateral primary osteoarthritis of hip: Secondary | ICD-10-CM | POA: Diagnosis not present

## 2023-01-12 DIAGNOSIS — M79673 Pain in unspecified foot: Secondary | ICD-10-CM | POA: Diagnosis not present

## 2023-01-12 DIAGNOSIS — M4317 Spondylolisthesis, lumbosacral region: Secondary | ICD-10-CM | POA: Diagnosis not present

## 2023-01-12 MED ORDER — MELOXICAM 15 MG PO TABS: 15.0000 mg | ORAL_TABLET | Freq: Every day | ORAL | 0 refills | Status: DC | PRN

## 2023-01-12 MED ORDER — KETOROLAC TROMETHAMINE 60 MG/2ML IM SOLN
60.0000 mg | Freq: Once | INTRAMUSCULAR | Status: AC
  Administered 2023-01-12: 60 mg via INTRAMUSCULAR

## 2023-01-12 NOTE — Progress Notes (Signed)
Acute Office Visit  Subjective:     Patient ID: Alexis Small, female    DOB: 31-Jan-1941, 82 y.o.   MRN: [id number]  Chief Complaint  Patient presents with   Hip Pain    HPI  Patient is in today for pain that radiates from her left  outer hip down her right outer thigh and then to her anterior shin and on top of her foot.  It started on November 1 so about 3 weeks ago she had been sitting in a hard chair at the hospital helping a friend when it started.  It is continued and is actually disrupting her sleep she is been taking ibuprofen and Tylenol which does give her some mild relief.  No other injury or trauma she cannot take or use steroids.  When she lift the opposite leg it causes pain in her left.   ROS      Objective:    BP 130/82   Pulse 85   Ht 4\' 11"  (1.499 m)   Wt 137 lb (62.1 kg)   SpO2 94%   BMI 27.67 kg/m    Physical Exam Vitals reviewed.  Constitutional:      Appearance: Normal appearance.  HENT:     Head: Normocephalic.  Pulmonary:     Effort: Pulmonary effort is normal.  Musculoskeletal:     Comments: Non tender over the lumbar spine.  Tender over the left SI joint.  Non tender over piriformis. Right hip flexion, extension, internal and external rotation normal.  Negative straight leg raise.     Left hip with dec ROM with external rotation and pain.   Weak with extension and flexion of her left knee.    Neurological:     Mental Status: She is alert and oriented to person, place, and time.  Psychiatric:        Mood and Affect: Mood normal.        Behavior: Behavior normal.     No results found for any visits on 01/12/23.      Assessment & Plan:   Problem List Items Addressed This Visit   None Visit Diagnoses     Left hip pain    -  Primary   Relevant Medications   ketorolac (TORADOL) injection 60 mg (Completed)   Other Relevant Orders   DG Lumbar Spine Complete   DG Hip Unilat W OR W/O Pelvis 2-3 Views Left   Left sciatic  nerve pain       Relevant Orders   DG Lumbar Spine Complete   DG Hip Unilat W OR W/O Pelvis 2-3 Views Left       Toradol  injection here for some more acute relief so that she can go to imaging downstairs and get plain films I am concerned that she has significant pain and decreased motion with external rotation of left hip but she also has some weakness with flexion and extension at the knee on the left side as well.  She is tender over that right SI joint.  Left sciatic nerve pain-the pain radiating down from her hip all the way to her foot is most consistent with sciatic distribution.  She is intolerant to steroids so we will switch to a 24-hour acting NSAID and overlap with Tylenol for pain control.  Meds ordered this encounter  Medications   meloxicam (MOBIC) 15 MG tablet    Sig: Take 1 tablet (15 mg total) by mouth daily as needed for pain.  Dispense:  30 tablet    Refill:  0   ketorolac (TORADOL) injection 60 mg    No follow-ups on file.  [name], MD

## 2023-01-12 NOTE — Telephone Encounter (Signed)
Pt advised of results and recommendations.   She said that the shot did help her with the pain.  She would like to think about the referral for PT and call back.

## 2023-01-12 NOTE — Patient Instructions (Signed)
You can also take 2 extra strength Tylenol up to 3 times a day on top of the meloxicam that I sent to the pharmacy.  Also consider using a lidocaine patch to help with pain and discomfort.  These are now over-the-counter.  Will call with x-ray results once available.

## 2023-01-12 NOTE — Telephone Encounter (Signed)
Please call pt and let her know films show some mild arthritis in hip but nothing concerning. I really thinkg this is coming from your back. You have a vertebra that is shifted out of place. Let try the medicine and I would recommend PT first.  We can do that here if you are ok with that.

## 2023-01-30 NOTE — Progress Notes (Signed)
Call patient: X-ray of the hip shows some mild degeneration and arthritis.  But nothing that looks severe.  Please see if her symptoms are better.  If not then would really like to get her in with Dr. Karie Schwalbe for further evaluation.

## 2023-01-30 NOTE — Progress Notes (Signed)
Call patient: Alexis Small of the spine shows mild arthritis and degeneration.  There is also some shifting of the L5 vertebrae over the S1 vertebrae that is new compared to an old film from 2019.  I would like to start by getting you in formal PT for your back if that something you would be okay with please let me know if not you would prefer to see our sports med doc we can get you in with Dr. Karie Schwalbe.

## 2023-02-08 ENCOUNTER — Other Ambulatory Visit: Payer: Self-pay | Admitting: Family Medicine

## 2023-02-09 DIAGNOSIS — M5432 Sciatica, left side: Secondary | ICD-10-CM | POA: Diagnosis not present

## 2023-02-09 DIAGNOSIS — M9903 Segmental and somatic dysfunction of lumbar region: Secondary | ICD-10-CM | POA: Diagnosis not present

## 2023-02-21 ENCOUNTER — Ambulatory Visit: Payer: Self-pay

## 2023-02-21 NOTE — Telephone Encounter (Signed)
 Copied from CRM [id number]. Topic: Clinical - Medical Advice >> Feb 21, 2023 10:32 AM Alexis Small wrote: Reason for CRM: Patient suffered a fall on 02/16/2023 where Alexis Small head hit the corner of a crate. The only symptom she is experiencing is a mild headache. I did schedule patient for Jan 8th; however, if there is anything available patient would love to come in sooner than later.

## 2023-02-21 NOTE — Telephone Encounter (Signed)
I spoke with patient and she has a mild headache. Denies vision changes, dizziness or confusion. She has been scheduled for Monday. I did advise her to go to the ED to be evaluated if headache worsens or has vision changes, dizziness or confusion.

## 2023-02-21 NOTE — Telephone Encounter (Signed)
Left message for a return call

## 2023-02-21 NOTE — Telephone Encounter (Signed)
Yes, lets triage this 1 and see if maybe we need to get her in next week

## 2023-02-27 ENCOUNTER — Encounter: Payer: Self-pay | Admitting: Family Medicine

## 2023-02-27 ENCOUNTER — Ambulatory Visit (INDEPENDENT_AMBULATORY_CARE_PROVIDER_SITE_OTHER): Payer: Medicare Other | Admitting: Family Medicine

## 2023-02-27 VITALS — BP 140/62 | HR 84 | Ht 59.0 in | Wt 142.0 lb

## 2023-02-27 DIAGNOSIS — H0100A Unspecified blepharitis right eye, upper and lower eyelids: Secondary | ICD-10-CM

## 2023-02-27 DIAGNOSIS — W1800XA Striking against unspecified object with subsequent fall, initial encounter: Secondary | ICD-10-CM

## 2023-02-27 DIAGNOSIS — S0990XA Unspecified injury of head, initial encounter: Secondary | ICD-10-CM

## 2023-02-27 NOTE — Patient Instructions (Signed)
 Please give [place] a call next week if you are not at least starting to feel better.

## 2023-02-27 NOTE — Progress Notes (Signed)
 Established Patient Office Visit  Subjective  Patient ID: Alexis Small, female    DOB: January 11, 1941  Age: 83 y.o. MRN: [id number]  Chief Complaint  Patient presents with   Fall    HPI  She had reached out to our office on New Year's Eve she suffered a fall from a hover board type device that belonged to her grandkids in December 26 where she hit her head on the corner of a crate.  She said she never developed a lump or goose egg.  She was experiencing a mild headache at that point.  She denied any vision changes dizziness or confusion.  She just feels really tired and a little bit more mentally sluggish.  She has not had any headaches in the last couple days only day that she had a headache was December 27.  They were able to get her appointment moved up to today.  She said she did not notice any bruises elsewhere and really has not had any pain or discomfort in her shoulders back or elbows after the fall.  She does have some prior chronic back pain but has been seeing a chiropractor and that sexually been working out well its provided a lot of pain relief for her.  She noticed a stye on her right eyelid this morning as it was red and a little irritated.  She said she just battled a stye on that same eye about a month or 2 ago and finally got it to go away.  She also had surgery on that eye back in the fall.    ROS    Objective:     BP (!) 140/62   Pulse 84   Ht 4' 11 (1.499 m)   Wt 142 lb (64.4 kg)   SpO2 100%   BMI 28.68 kg/m    Physical Exam Constitutional:      Appearance: Normal appearance.  HENT:     Head: Normocephalic and atraumatic.     Right Ear: Tympanic membrane, ear canal and external ear normal. There is no impacted cerumen.     Left Ear: Tympanic membrane, ear canal and external ear normal. There is no impacted cerumen.     Nose: Nose normal.     Mouth/Throat:     Pharynx: Oropharynx is clear.  Eyes:     Conjunctiva/sclera: Conjunctivae normal.   Pulmonary:     Effort: Pulmonary effort is normal.  Musculoskeletal:     Cervical back: Neck supple. No tenderness.  Lymphadenopathy:     Cervical: No cervical adenopathy.  Skin:    General: Skin is warm and dry.  Neurological:     General: No focal deficit present.     Mental Status: She is alert and oriented to person, place, and time.     Cranial Nerves: No cranial nerve deficit.     Deep Tendon Reflexes: Reflexes normal.  Psychiatric:        Mood and Affect: Mood normal.      No results found for any visits on 02/27/23.    The ASCVD Risk score (Arnett DK, et al., 2019) failed to calculate for the following reasons:   The 2019 ASCVD risk score is only valid for ages 50 to 56    Assessment & Plan:   Problem List Items Addressed This Visit   None Visit Diagnoses       Injury of head, initial encounter    -  Primary     Fall against object  Blepharitis of both upper and lower eyelid of right eye, unspecified type          Head injury status post fall-we discussed getting some additional imaging  since she still feels tired and a little mental until he sluggish.  She said she would rather give it 1 more week we discussed looking out for new symptoms such as headaches nausea vomiting etc. she develops anything new then going to do some imaging.   Lids are slightly erythematous-she starts to notice any discomfort or irritation in the eye itself encouraged her to follow-up with her eye doctor otherwise she is gena continue with warm compresses for the stye.  Return if symptoms worsen or fail to improve.    Dorothyann Byars, MD

## 2023-02-28 DIAGNOSIS — M5432 Sciatica, left side: Secondary | ICD-10-CM | POA: Diagnosis not present

## 2023-02-28 DIAGNOSIS — M9903 Segmental and somatic dysfunction of lumbar region: Secondary | ICD-10-CM | POA: Diagnosis not present

## 2023-03-01 ENCOUNTER — Ambulatory Visit: Payer: Medicare Other | Admitting: Family Medicine

## 2023-03-07 DIAGNOSIS — M5432 Sciatica, left side: Secondary | ICD-10-CM | POA: Diagnosis not present

## 2023-03-07 DIAGNOSIS — M9903 Segmental and somatic dysfunction of lumbar region: Secondary | ICD-10-CM | POA: Diagnosis not present

## 2023-03-14 DIAGNOSIS — M5432 Sciatica, left side: Secondary | ICD-10-CM | POA: Diagnosis not present

## 2023-03-14 DIAGNOSIS — M9903 Segmental and somatic dysfunction of lumbar region: Secondary | ICD-10-CM | POA: Diagnosis not present

## 2023-03-21 DIAGNOSIS — H00012 Hordeolum externum right lower eyelid: Secondary | ICD-10-CM | POA: Diagnosis not present

## 2023-03-21 DIAGNOSIS — M5432 Sciatica, left side: Secondary | ICD-10-CM | POA: Diagnosis not present

## 2023-03-21 DIAGNOSIS — M9903 Segmental and somatic dysfunction of lumbar region: Secondary | ICD-10-CM | POA: Diagnosis not present

## 2023-04-28 DIAGNOSIS — L718 Other rosacea: Secondary | ICD-10-CM | POA: Diagnosis not present

## 2023-04-28 DIAGNOSIS — L814 Other melanin hyperpigmentation: Secondary | ICD-10-CM | POA: Diagnosis not present

## 2023-04-28 DIAGNOSIS — L2089 Other atopic dermatitis: Secondary | ICD-10-CM | POA: Diagnosis not present

## 2023-04-28 DIAGNOSIS — D225 Melanocytic nevi of trunk: Secondary | ICD-10-CM | POA: Diagnosis not present

## 2023-04-28 DIAGNOSIS — L2989 Other pruritus: Secondary | ICD-10-CM | POA: Diagnosis not present

## 2023-04-28 DIAGNOSIS — L218 Other seborrheic dermatitis: Secondary | ICD-10-CM | POA: Diagnosis not present

## 2023-04-28 DIAGNOSIS — L821 Other seborrheic keratosis: Secondary | ICD-10-CM | POA: Diagnosis not present

## 2023-04-28 DIAGNOSIS — L819 Disorder of pigmentation, unspecified: Secondary | ICD-10-CM | POA: Diagnosis not present

## 2023-05-07 DIAGNOSIS — S0990XA Unspecified injury of head, initial encounter: Secondary | ICD-10-CM | POA: Diagnosis not present

## 2023-05-07 DIAGNOSIS — S82044A Nondisplaced comminuted fracture of right patella, initial encounter for closed fracture: Secondary | ICD-10-CM | POA: Diagnosis not present

## 2023-05-07 DIAGNOSIS — S82041A Displaced comminuted fracture of right patella, initial encounter for closed fracture: Secondary | ICD-10-CM | POA: Diagnosis not present

## 2023-05-07 DIAGNOSIS — S199XXA Unspecified injury of neck, initial encounter: Secondary | ICD-10-CM | POA: Diagnosis not present

## 2023-05-07 DIAGNOSIS — Z043 Encounter for examination and observation following other accident: Secondary | ICD-10-CM | POA: Diagnosis not present

## 2023-05-07 DIAGNOSIS — W010XXA Fall on same level from slipping, tripping and stumbling without subsequent striking against object, initial encounter: Secondary | ICD-10-CM | POA: Diagnosis not present

## 2023-05-07 DIAGNOSIS — M79622 Pain in left upper arm: Secondary | ICD-10-CM | POA: Diagnosis not present

## 2023-05-08 DIAGNOSIS — S82044A Nondisplaced comminuted fracture of right patella, initial encounter for closed fracture: Secondary | ICD-10-CM | POA: Diagnosis not present

## 2023-05-09 DIAGNOSIS — S82044A Nondisplaced comminuted fracture of right patella, initial encounter for closed fracture: Secondary | ICD-10-CM | POA: Diagnosis not present

## 2023-05-11 DIAGNOSIS — S82044A Nondisplaced comminuted fracture of right patella, initial encounter for closed fracture: Secondary | ICD-10-CM | POA: Diagnosis not present

## 2023-05-23 DIAGNOSIS — M25561 Pain in right knee: Secondary | ICD-10-CM | POA: Diagnosis not present

## 2023-05-23 DIAGNOSIS — M25532 Pain in left wrist: Secondary | ICD-10-CM | POA: Diagnosis not present

## 2023-05-23 DIAGNOSIS — M25522 Pain in left elbow: Secondary | ICD-10-CM | POA: Diagnosis not present

## 2023-05-23 DIAGNOSIS — S82044A Nondisplaced comminuted fracture of right patella, initial encounter for closed fracture: Secondary | ICD-10-CM | POA: Diagnosis not present

## 2023-05-30 DIAGNOSIS — H04121 Dry eye syndrome of right lacrimal gland: Secondary | ICD-10-CM | POA: Diagnosis not present

## 2023-05-30 DIAGNOSIS — Z947 Corneal transplant status: Secondary | ICD-10-CM | POA: Diagnosis not present

## 2023-06-01 DIAGNOSIS — S82044A Nondisplaced comminuted fracture of right patella, initial encounter for closed fracture: Secondary | ICD-10-CM | POA: Diagnosis not present

## 2023-06-22 DIAGNOSIS — S82044A Nondisplaced comminuted fracture of right patella, initial encounter for closed fracture: Secondary | ICD-10-CM | POA: Diagnosis not present

## 2023-06-22 DIAGNOSIS — S82044D Nondisplaced comminuted fracture of right patella, subsequent encounter for closed fracture with routine healing: Secondary | ICD-10-CM | POA: Diagnosis not present

## 2023-07-14 DIAGNOSIS — S82044D Nondisplaced comminuted fracture of right patella, subsequent encounter for closed fracture with routine healing: Secondary | ICD-10-CM | POA: Diagnosis not present

## 2023-07-14 DIAGNOSIS — M25561 Pain in right knee: Secondary | ICD-10-CM | POA: Diagnosis not present

## 2023-07-18 ENCOUNTER — Ambulatory Visit (INDEPENDENT_AMBULATORY_CARE_PROVIDER_SITE_OTHER): Admitting: Family Medicine

## 2023-07-18 ENCOUNTER — Encounter: Payer: Self-pay | Admitting: Family Medicine

## 2023-07-18 VITALS — BP 135/59 | HR 87 | Ht 59.0 in | Wt 142.1 lb

## 2023-07-18 DIAGNOSIS — R413 Other amnesia: Secondary | ICD-10-CM | POA: Insufficient documentation

## 2023-07-18 NOTE — Progress Notes (Signed)
   Established Patient Office Visit  Subjective  Patient ID: Alexis Small, female    DOB: 08-Nov-1940  Age: 83 y.o. MRN: [id number]  Chief Complaint  Patient presents with   Memory Loss    HPI She is here today for concerns regarding her memory.  She fell back in March and was seen at the emergency department at [facility] and unfortunately suffered a right patellar fracture.  So she has been recovering from that it has been quite painful since then she has been living with her daughter and her daughter in particular is the one that has noted that she does not seem as sharp and comprehending things quite as quickly she is repeating herself more often.  Occasionally misplacing things.  She has been struggling with sleep lately in part because of pain she says the last couple nights she is actually slept a little bit better  [employer] Visit from 11/10/2022 in [facility] at [facility] Total Score 0        Has hearing aids in .today.    She did have a head CT May 07, 2023 at [facility] which showed no sign of intracranial hemorrhage edema or mass.  No evidence of acute infarct.  No sinus disease.     ROS    Objective:     BP (!) 135/59   Pulse 87   Ht 4\' 11"  (1.499 m)   Wt 142 lb 1.9 oz (64.5 kg)   SpO2 99%   BMI 28.70 kg/m    Physical Exam Vitals reviewed.  Constitutional:      Appearance: Normal appearance.  HENT:     Head: Normocephalic.  Pulmonary:     Effort: Pulmonary effort is normal.  Neurological:     Mental Status: She is alert and oriented to person, place, and time.  Psychiatric:        Mood and Affect: Mood normal.        Behavior: Behavior normal.      No results found for any visits on 07/18/23.    The ASCVD Risk score (Arnett DK, et al., 2019) failed to calculate for the following reasons:   The 2019 ASCVD risk score is only valid for ages 24 to 61    Assessment  & Plan:   Problem List Items Addressed This Visit       Other   Memory impairment - Primary   MoCA score today 27 out of 30 she actually did pretty well she did miss points on attention, language and abstraction.  She is hard of hearing but did have her hearing aids in today.  She did have a recent head CT which was negative which is reassuring we will check labs today and check for thyroid  disorder, deficiencies etc. if everything comes back normal consider referral to neurology for further workup      Relevant Orders   CBC with Differential/Platelet   CMP14+EGFR   Vitamin B1   B12 and Folate Panel   Vitamin B6   Magnesium   Zinc   RPR   TSH   Heavy Metals Profile II, Blood    No follow-ups on file.    Duaine German, MD

## 2023-07-18 NOTE — Assessment & Plan Note (Signed)
 MoCA score today 27 out of 30 she actually did pretty well she did miss points on attention, language and abstraction.  She is hard of hearing but did have her hearing aids in today.  She did have a recent head CT which was negative which is reassuring we will check labs today and check for thyroid  disorder, deficiencies etc. if everything comes back normal consider referral to neurology for further workup

## 2023-07-19 ENCOUNTER — Ambulatory Visit: Payer: Self-pay | Admitting: Family Medicine

## 2023-07-19 NOTE — Progress Notes (Signed)
 Call patient: Blood count and metabolic panel look good so far.  Vitamin B12 and folate are normal.  Magnesium is normal.  Negative RPR.  Thyroid  looks great.  Vitamin B6 and B1 and zinc  are still pending.  Also heavy metal testing is still pending as well.

## 2023-07-20 ENCOUNTER — Telehealth: Payer: Self-pay

## 2023-07-20 NOTE — Telephone Encounter (Signed)
 Copied from CRM [id number]. Topic: Clinical - Lab/Test Results >> Jul 19, 2023  5:25 PM Alexis Small wrote: Reason for CRM: Patient called about labs, I read her the results note

## 2023-07-20 NOTE — Telephone Encounter (Signed)
 Spoke with patient.

## 2023-07-23 LAB — CMP14+EGFR
ALT: 7 IU/L (ref 0–32)
AST: 16 IU/L (ref 0–40)
Albumin: 4 g/dL (ref 3.7–4.7)
Alkaline Phosphatase: 100 IU/L (ref 44–121)
BUN/Creatinine Ratio: 23 (ref 12–28)
BUN: 19 mg/dL (ref 8–27)
Bilirubin Total: 0.4 mg/dL (ref 0.0–1.2)
CO2: 21 mmol/L (ref 20–29)
Calcium: 8.8 mg/dL (ref 8.7–10.3)
Chloride: 103 mmol/L (ref 96–106)
Creatinine, Ser: 0.82 mg/dL (ref 0.57–1.00)
Globulin, Total: 2.6 g/dL (ref 1.5–4.5)
Glucose: 83 mg/dL (ref 70–99)
Potassium: 4 mmol/L (ref 3.5–5.2)
Sodium: 140 mmol/L (ref 134–144)
Total Protein: 6.6 g/dL (ref 6.0–8.5)
eGFR: 71 mL/min/{1.73_m2} (ref >59)

## 2023-07-23 LAB — ZINC: Zinc: 57 ug/dL (ref 44–115)

## 2023-07-23 LAB — B12 AND FOLATE PANEL
Folate: >20 ng/mL (ref >3.0)
Vitamin B-12: 530 pg/mL (ref 232–1245)

## 2023-07-23 LAB — MAGNESIUM: Magnesium: 1.9 mg/dL (ref 1.6–2.3)

## 2023-07-23 LAB — CBC WITH DIFFERENTIAL/PLATELET
Basophils Absolute: 0.1 10*3/uL (ref 0.0–0.2)
Basos: 1 %
EOS (ABSOLUTE): 0.3 10*3/uL (ref 0.0–0.4)
Eos: 4 %
Hematocrit: 39.9 % (ref 34.0–46.6)
Hemoglobin: 12.8 g/dL (ref 11.1–15.9)
Immature Grans (Abs): 0 10*3/uL (ref 0.0–0.1)
Immature Granulocytes: 0 %
Lymphocytes Absolute: 1.4 10*3/uL (ref 0.7–3.1)
Lymphs: 18 %
MCH: 29.1 pg (ref 26.6–33.0)
MCHC: 32.1 g/dL (ref 31.5–35.7)
MCV: 91 fL (ref 79–97)
Monocytes Absolute: 0.7 10*3/uL (ref 0.1–0.9)
Monocytes: 9 %
Neutrophils Absolute: 5.3 10*3/uL (ref 1.4–7.0)
Neutrophils: 68 %
Platelets: 173 10*3/uL (ref 150–450)
RBC: 4.4 x10E6/uL (ref 3.77–5.28)
RDW: 13.5 % (ref 11.7–15.4)
WBC: 7.8 10*3/uL (ref 3.4–10.8)

## 2023-07-23 LAB — HEAVY METALS PROFILE II, BLOOD
Arsenic: 3 ug/L (ref 0–9)
Cadmium: <0.5 ug/L (ref 0.0–1.2)
Lead, Blood: 1.5 ug/dL (ref 0.0–3.4)
Mercury: 1.9 ug/L (ref 0.0–14.9)

## 2023-07-23 LAB — TSH: TSH: 2.47 u[IU]/mL (ref 0.450–4.500)

## 2023-07-23 LAB — RPR: RPR Ser Ql: NONREACTIVE

## 2023-07-23 LAB — VITAMIN B1: Thiamine: 95.8 nmol/L (ref 66.5–200.0)

## 2023-07-23 LAB — VITAMIN B6: Vitamin B6: 6.2 ug/L (ref 3.4–65.2)

## 2023-07-24 NOTE — Progress Notes (Signed)
 Call pt: Heavy metal testing is OK.  B1 and B6 are normal. Would you like further workup by Neruology at this point for your memory?

## 2023-09-11 DIAGNOSIS — M9903 Segmental and somatic dysfunction of lumbar region: Secondary | ICD-10-CM | POA: Diagnosis not present

## 2023-09-11 DIAGNOSIS — M5432 Sciatica, left side: Secondary | ICD-10-CM | POA: Diagnosis not present

## 2023-09-25 DIAGNOSIS — M9903 Segmental and somatic dysfunction of lumbar region: Secondary | ICD-10-CM | POA: Diagnosis not present

## 2023-09-25 DIAGNOSIS — M5432 Sciatica, left side: Secondary | ICD-10-CM | POA: Diagnosis not present

## 2023-10-10 DIAGNOSIS — M9903 Segmental and somatic dysfunction of lumbar region: Secondary | ICD-10-CM | POA: Diagnosis not present

## 2023-10-10 DIAGNOSIS — M5432 Sciatica, left side: Secondary | ICD-10-CM | POA: Diagnosis not present

## 2023-11-13 ENCOUNTER — Encounter: Payer: Self-pay | Admitting: Family Medicine

## 2023-11-13 ENCOUNTER — Ambulatory Visit (INDEPENDENT_AMBULATORY_CARE_PROVIDER_SITE_OTHER): Payer: Medicare Other | Admitting: Family Medicine

## 2023-11-13 VITALS — BP 132/72 | HR 67 | Ht 59.0 in | Wt 140.1 lb

## 2023-11-13 DIAGNOSIS — Z0000 Encounter for general adult medical examination without abnormal findings: Secondary | ICD-10-CM

## 2023-11-13 NOTE — Progress Notes (Signed)
 Complete physical exam  Patient: Alexis Small   DOB: 10/07/1940   83 y.o. Female  MRN: [id number]  Subjective:    Chief Complaint  Patient presents with   Annual Exam    Alexis Small is a 83 y.o. female who presents today for a complete physical exam. She reports consuming a general diet. Walks for exercise and does senior chair exercises. She generally feels well. She reports sleeping fairly well. She does not have additional problems to discuss today.    Most recent fall risk assessment:    11/10/2022   10:48 AM  Fall Risk   Falls in the past year? 0  Number falls in past yr: 0  Injury with Fall? 0  Risk for fall due to : No Fall Risks  Follow up Falls evaluation completed     Most recent depression screenings:    11/10/2022   10:48 AM 11/08/2021   11:00 AM  PHQ 2/9 Scores  PHQ - 2 Score 0 0         Patient Care Team: Alvan Dorothyann BIRCH, MD as PCP - General   Outpatient Medications Prior to Visit  Medication Sig   Calcium Carbonate-Vitamin D  600-200 MG-UNIT TABS Take 1 tablet by mouth at bedtime.   Probiotic Product (PROBIOTIC-10 PO) Take by mouth daily.   Turmeric 400 MG CAPS Take by mouth.   [DISCONTINUED] meloxicam  (MOBIC ) 15 MG tablet Take 1 tablet (15 mg total) by mouth daily as needed for pain.   No facility-administered medications prior to visit.    ROS        Objective:     BP 132/72   Pulse 67   Ht 4' 11 (1.499 m)   Wt 140 lb 1.9 oz (63.6 kg)   SpO2 100%   BMI 28.30 kg/m     Physical Exam Constitutional:      Appearance: Normal appearance.  HENT:     Head: Normocephalic and atraumatic.     Right Ear: Tympanic membrane, ear canal and external ear normal.     Left Ear: Tympanic membrane, ear canal and external ear normal.     Nose: Nose normal.     Mouth/Throat:     Pharynx: Oropharynx is clear.  Eyes:     Extraocular Movements: Extraocular movements intact.     Conjunctiva/sclera: Conjunctivae normal.      Pupils: Pupils are equal, round, and reactive to light.  Neck:     Thyroid : No thyromegaly.  Cardiovascular:     Rate and Rhythm: Normal rate and regular rhythm.  Pulmonary:     Effort: Pulmonary effort is normal.     Breath sounds: Normal breath sounds.  Abdominal:     General: Bowel sounds are normal.     Palpations: Abdomen is soft.     Tenderness: There is no abdominal tenderness.  Musculoskeletal:        General: No swelling.     Cervical back: Neck supple.  Skin:    General: Skin is warm and dry.  Neurological:     Mental Status: She is oriented to person, place, and time.  Psychiatric:        Mood and Affect: Mood normal.        Behavior: Behavior normal.      No results found for any visits on 11/13/23.      Assessment & Plan:    Routine Health Maintenance and Physical Exam  Immunization History  Administered Date(s) Administered  Moderna Sars-Covid-2 Vaccination 10/12/2019   Pneumococcal Polysaccharide-23 12/15/2011   Tdap 09/14/2010, 05/20/2018   Zoster, Live 09/14/2010    Health Maintenance  Topic Date Due   Zoster Vaccines- Shingrix (1 of 2) 08/15/1959   COVID-19 Vaccine (2 - Moderna risk series) 11/09/2019   Medicare Annual Wellness (AWV)  11/10/2023   Pneumococcal Vaccine: 50+ Years (2 of 2 - PCV) 01/15/2024 (Originally 12/14/2012)   Influenza Vaccine  05/21/2024 (Originally 09/22/2023)   DTaP/Tdap/Td (3 - Td or Tdap) 05/19/2028   DEXA SCAN  Completed   HPV VACCINES  Aged Out   Meningococcal B Vaccine  Aged Out    Discussed health benefits of physical activity, and encouraged her to engage in regular exercise appropriate for her age and condition.  Problem List Items Addressed This Visit   None Visit Diagnoses       Wellness examination    -  Primary      Declines flu vaccine.  She thinks she has had her shingles vaccine but we don't have a copy of it.    Return in about 1 year (around 11/12/2024) for Wellness Exam.     Dorothyann Byars, MD

## 2023-11-30 DIAGNOSIS — Z947 Corneal transplant status: Secondary | ICD-10-CM | POA: Diagnosis not present

## 2024-02-09 ENCOUNTER — Ambulatory Visit: Admitting: Medical-Surgical

## 2024-11-13 ENCOUNTER — Encounter: Admitting: Family Medicine
